# Patient Record
Sex: Male | Born: 1935 | Race: White | Hispanic: No | Marital: Married | Smoking: Former smoker
Health system: Southern US, Community
[De-identification: ages and names within clinical notes are randomized; demographics above are authoritative.]

## PROBLEM LIST (undated history)

## (undated) DIAGNOSIS — Z9889 Other specified postprocedural states: Secondary | ICD-10-CM

## (undated) DIAGNOSIS — C61 Malignant neoplasm of prostate: Secondary | ICD-10-CM

## (undated) DIAGNOSIS — I2510 Atherosclerotic heart disease of native coronary artery without angina pectoris: Secondary | ICD-10-CM

## (undated) DIAGNOSIS — I219 Acute myocardial infarction, unspecified: Secondary | ICD-10-CM

## (undated) DIAGNOSIS — N209 Urinary calculus, unspecified: Secondary | ICD-10-CM

## (undated) DIAGNOSIS — M1990 Unspecified osteoarthritis, unspecified site: Secondary | ICD-10-CM

## (undated) DIAGNOSIS — R112 Nausea with vomiting, unspecified: Secondary | ICD-10-CM

## (undated) DIAGNOSIS — Z8489 Family history of other specified conditions: Secondary | ICD-10-CM

## (undated) DIAGNOSIS — I10 Essential (primary) hypertension: Secondary | ICD-10-CM

## (undated) DIAGNOSIS — Z9981 Dependence on supplemental oxygen: Secondary | ICD-10-CM

## (undated) DIAGNOSIS — E785 Hyperlipidemia, unspecified: Secondary | ICD-10-CM

## (undated) DIAGNOSIS — E039 Hypothyroidism, unspecified: Secondary | ICD-10-CM

## (undated) DIAGNOSIS — I639 Cerebral infarction, unspecified: Secondary | ICD-10-CM

## (undated) DIAGNOSIS — K219 Gastro-esophageal reflux disease without esophagitis: Secondary | ICD-10-CM

## (undated) DIAGNOSIS — J45909 Unspecified asthma, uncomplicated: Secondary | ICD-10-CM

## (undated) DIAGNOSIS — C4490 Unspecified malignant neoplasm of skin, unspecified: Secondary | ICD-10-CM

## (undated) HISTORY — PX: INGUINAL HERNIA REPAIR: SUR1180

## (undated) HISTORY — PX: BACK SURGERY: SHX140

## (undated) HISTORY — DX: Unspecified asthma, uncomplicated: J45.909

## (undated) HISTORY — DX: Essential (primary) hypertension: I10

## (undated) HISTORY — DX: Hyperlipidemia, unspecified: E78.5

## (undated) HISTORY — PX: JOINT REPLACEMENT: SHX530

## (undated) HISTORY — PX: TONSILLECTOMY: SUR1361

## (undated) HISTORY — PX: CATARACT EXTRACTION W/ INTRAOCULAR LENS  IMPLANT, BILATERAL: SHX1307

---

## 1986-01-15 DIAGNOSIS — I639 Cerebral infarction, unspecified: Secondary | ICD-10-CM

## 1986-01-15 HISTORY — DX: Cerebral infarction, unspecified: I63.9

## 2004-03-03 ENCOUNTER — Ambulatory Visit (HOSPITAL_BASED_OUTPATIENT_CLINIC_OR_DEPARTMENT_OTHER): Admission: RE | Admit: 2004-03-03 | Discharge: 2004-03-03 | Payer: Self-pay | Admitting: Surgery

## 2004-03-03 ENCOUNTER — Ambulatory Visit (HOSPITAL_COMMUNITY): Admission: RE | Admit: 2004-03-03 | Discharge: 2004-03-03 | Payer: Self-pay | Admitting: Surgery

## 2004-03-03 ENCOUNTER — Encounter (INDEPENDENT_AMBULATORY_CARE_PROVIDER_SITE_OTHER): Payer: Self-pay | Admitting: Specialist

## 2004-09-06 ENCOUNTER — Ambulatory Visit (HOSPITAL_COMMUNITY): Admission: RE | Admit: 2004-09-06 | Discharge: 2004-09-06 | Payer: Self-pay | Admitting: Orthopedic Surgery

## 2004-09-06 ENCOUNTER — Ambulatory Visit (HOSPITAL_BASED_OUTPATIENT_CLINIC_OR_DEPARTMENT_OTHER): Admission: RE | Admit: 2004-09-06 | Discharge: 2004-09-06 | Payer: Self-pay | Admitting: Orthopedic Surgery

## 2006-02-07 ENCOUNTER — Ambulatory Visit: Admission: RE | Admit: 2006-02-07 | Discharge: 2006-05-22 | Payer: Self-pay | Admitting: *Deleted

## 2006-05-09 ENCOUNTER — Ambulatory Visit: Admission: RE | Admit: 2006-05-09 | Discharge: 2006-08-07 | Payer: Self-pay | Admitting: *Deleted

## 2006-06-29 ENCOUNTER — Emergency Department (HOSPITAL_COMMUNITY): Admission: EM | Admit: 2006-06-29 | Discharge: 2006-06-29 | Payer: Self-pay | Admitting: Emergency Medicine

## 2007-07-07 ENCOUNTER — Encounter: Payer: Self-pay | Admitting: Pulmonary Disease

## 2007-07-10 ENCOUNTER — Ambulatory Visit: Payer: Self-pay | Admitting: Pulmonary Disease

## 2007-07-10 DIAGNOSIS — I10 Essential (primary) hypertension: Secondary | ICD-10-CM | POA: Insufficient documentation

## 2007-07-10 DIAGNOSIS — R0609 Other forms of dyspnea: Secondary | ICD-10-CM

## 2007-07-10 DIAGNOSIS — R0989 Other specified symptoms and signs involving the circulatory and respiratory systems: Secondary | ICD-10-CM

## 2007-07-10 DIAGNOSIS — E785 Hyperlipidemia, unspecified: Secondary | ICD-10-CM

## 2007-07-16 ENCOUNTER — Ambulatory Visit: Payer: Self-pay | Admitting: Pulmonary Disease

## 2007-07-22 ENCOUNTER — Telehealth (INDEPENDENT_AMBULATORY_CARE_PROVIDER_SITE_OTHER): Payer: Self-pay | Admitting: *Deleted

## 2007-07-23 ENCOUNTER — Ambulatory Visit: Payer: Self-pay | Admitting: Pulmonary Disease

## 2007-07-23 DIAGNOSIS — J449 Chronic obstructive pulmonary disease, unspecified: Secondary | ICD-10-CM

## 2007-10-23 ENCOUNTER — Ambulatory Visit: Payer: Self-pay | Admitting: Pulmonary Disease

## 2007-12-26 ENCOUNTER — Telehealth (INDEPENDENT_AMBULATORY_CARE_PROVIDER_SITE_OTHER): Payer: Self-pay | Admitting: *Deleted

## 2008-03-04 ENCOUNTER — Encounter (HOSPITAL_COMMUNITY): Admission: RE | Admit: 2008-03-04 | Discharge: 2008-05-18 | Payer: Self-pay | Admitting: Urology

## 2008-04-21 ENCOUNTER — Ambulatory Visit: Payer: Self-pay | Admitting: Pulmonary Disease

## 2008-10-19 ENCOUNTER — Ambulatory Visit: Payer: Self-pay | Admitting: Pulmonary Disease

## 2009-02-22 ENCOUNTER — Encounter: Admission: RE | Admit: 2009-02-22 | Discharge: 2009-02-22 | Payer: Self-pay | Admitting: Endocrinology

## 2009-02-22 ENCOUNTER — Encounter: Payer: Self-pay | Admitting: Pulmonary Disease

## 2009-03-24 ENCOUNTER — Encounter: Payer: Self-pay | Admitting: Pulmonary Disease

## 2009-03-24 DIAGNOSIS — R930 Abnormal findings on diagnostic imaging of skull and head, not elsewhere classified: Secondary | ICD-10-CM

## 2009-03-29 ENCOUNTER — Ambulatory Visit: Payer: Self-pay | Admitting: Cardiology

## 2009-03-30 ENCOUNTER — Telehealth: Payer: Self-pay | Admitting: Pulmonary Disease

## 2009-04-28 ENCOUNTER — Ambulatory Visit: Payer: Self-pay | Admitting: Pulmonary Disease

## 2009-04-28 DIAGNOSIS — J984 Other disorders of lung: Secondary | ICD-10-CM | POA: Insufficient documentation

## 2009-05-25 ENCOUNTER — Emergency Department (HOSPITAL_COMMUNITY): Admission: EM | Admit: 2009-05-25 | Discharge: 2009-05-25 | Payer: Self-pay | Admitting: Emergency Medicine

## 2009-05-30 ENCOUNTER — Encounter (HOSPITAL_COMMUNITY): Admission: RE | Admit: 2009-05-30 | Discharge: 2009-08-28 | Payer: Self-pay | Admitting: Urology

## 2009-10-11 ENCOUNTER — Encounter: Admission: RE | Admit: 2009-10-11 | Discharge: 2009-10-11 | Payer: Self-pay | Admitting: Orthopaedic Surgery

## 2009-10-15 HISTORY — PX: ANTERIOR CERVICAL DISCECTOMY: SHX1160

## 2009-10-24 ENCOUNTER — Inpatient Hospital Stay (HOSPITAL_COMMUNITY)
Admission: RE | Admit: 2009-10-24 | Discharge: 2009-10-25 | Payer: Self-pay | Source: Home / Self Care | Admitting: Orthopaedic Surgery

## 2009-10-26 ENCOUNTER — Telehealth: Payer: Self-pay | Admitting: Pulmonary Disease

## 2009-11-23 ENCOUNTER — Ambulatory Visit: Payer: Self-pay | Admitting: Pulmonary Disease

## 2010-02-14 NOTE — Assessment & Plan Note (Signed)
Summary: rov for asthma, pulm nodule.   Copy to:  Waldron Callas Primary Provider/Referring Provider:  Altheimer  CC:  6 month follow up , uses advair , and rarely needs his rescue inhler.  History of Present Illness: the pt comes in today for f/u of his known asthma.  He has been doing very well on his current regimen, with no flares or increased rescue inhaler use.  He denies cough or congestion.  He also had a recent ct chest to f/u ?right hilar abnl, and showed only a 4mm noncalcified density in RLL as well as other calcified nodules and a calcified right hilar node.  This is most likely due to old granulomatous disease.  Current Medications (verified): 1)  Accolate 20 Mg  Tabs (Zafirlukast) .... Take 1 Tablet By Mouth Two Times A Day 2)  Proventil Hfa 108 (90 Base) Mcg/act  Aers (Albuterol Sulfate) .... Inhale 2 Puffs Every 4 To 6 Hours As Needed 3)  Advair Hfa 230-21 Mcg/act Aero (Fluticasone-Salmeterol) .... Inhale 2 Puffs Two Times A Day 4)  Lipitor 10 Mg  Tabs (Atorvastatin Calcium) .... Take 1/2 Tab As Directed 5)  Tekturna 150 Mg  Tabs (Aliskiren Fumarate) .... Take 1 Tab As Directed 6)  Norvasc 2.5 Mg Tabs (Amlodipine Besylate) .Marland Kitchen.. 1 Once Daily  Allergies (verified): 1)  ! Demerol  Review of Systems      See HPI  Vital Signs:  Patient profile:   77 year old male Height:      70 inches Weight:      160.6 pounds O2 Sat:      98 % on Room air Temp:     97.3 degrees F oral Pulse rate:   67 / minute BP sitting:   130 / 68  (left arm)  Vitals Entered By: Renold Genta RCP, LPN (April 28, 2009 9:02 AM)  O2 Flow:  Room air CC: 6 month follow up , uses advair , rarely needs his rescue inhler Is Patient Diabetic? No Comments Medications reviewed with patient Renold Genta RCP, LPN  April 28, 2009 9:08 AM    Physical Exam  General:  thin male in nad Lungs:  chest clear to auscultation Heart:  rrr, no mrg   Impression & Recommendations:  Problem # 1:  CHRONIC  OBSTRUCTIVE ASTHMA UNSPECIFIED (ICD-493.20) the pt is doing very well from an asthma standpoint.  No excessive rescue inhaler use, and no recent exacerbation.  Problem # 2:  PULMONARY NODULE (ICD-518.89) The pt has a 4mm nodule on recent ct that needs f/u in one year.  He has other findings suggestive of old granulomatous disease, and I suspect that is the explanation for the noncalcified nodule that is going to be followed up as well.  Medications Added to Medication List This Visit: 1)  Norvasc 2.5 Mg Tabs (Amlodipine besylate) .Marland Kitchen.. 1 once daily  Other Orders: Est. Patient Level II (84696)  Patient Instructions: 1)  no change in meds. 2)  can try zyrtec 10mg  one at bedtime for allergy symptoms. 3)  will check ct chest next year for nodule. 4)  followup with me in 6mos.

## 2010-02-14 NOTE — Progress Notes (Signed)
Summary: nos appt  Phone Note Call from Patient   Caller: [email or web address]  Call For: Mylinda Brook Summary of Call: In ref to 10/11 nos, pt states he will call next week to rsc. Initial call taken by: Darletta Moll,  October 26, 2009 9:50 AM

## 2010-02-14 NOTE — Miscellaneous (Signed)
Summary: order for chest ct   Clinical Lists Changes  Problems: Added new problem of ABNORMAL CHEST XRAY (ICD-793.1) Orders: Added new Referral order of Radiology Referral (Radiology) - Signed sent a cxr report from Dr. Leslie Dales where radiologist questioned right hilar abnl.  the pt has had a recent bump in psa, and after discussion with pt, he would like verification.  Will order ct chest.

## 2010-02-14 NOTE — Progress Notes (Signed)
Summary: results  Phone Note Call from Patient Call back at Home Phone [phone]   Caller: Patient Call For: clance Summary of Call: pt wants CT results.  Initial call taken by: Tivis Ringer, CNA,  March 30, 2009 4:29 PM  Follow-up for Phone Call        pt calling for CT results from 03-29-2009.  Results unsigned.  Please advise.  Thank you.  Arman Filter LPN  March 30, 2009 4:53 PM   Additional Follow-up for Phone Call Additional follow up Details #1::        done Additional Follow-up by: Barbaraann Share MD,  March 31, 2009 6:07 PM

## 2010-02-14 NOTE — Assessment & Plan Note (Signed)
Summary: rov for asthma, rhinitis   Visit Type:  Follow-up Copy to:  Guthrie Callas Primary Provider/Referring Provider:  Altheimer  CC:  follow up. pt states breahting has been doing "fine". Pt c/o nasal congestion daily. Pt recently had surgery oct 10  to seperate 2 disks in the back of neck. pt needs rx for advair hfa. pt would like flu shot today .  History of Present Illness: the pt comes in today for f/u of his fixed asthma.  He has been compliant with his meds, and feels that he is doing quite well.  He has not had an exacerbation in years, and rarely uses his rescue inhaler.  He still has transient very mild dysphonia, but does not really bother him that much.  He denies any cough or congestion.  He continues to have issues with postnasal drip, and zyrtec really didn't help much.  Current Medications (verified): 1)  Accolate 20 Mg  Tabs (Zafirlukast) .... Take 1 Tablet By Mouth Two Times A Day 2)  Proventil Hfa 108 (90 Base) Mcg/act  Aers (Albuterol Sulfate) .... Inhale 2 Puffs Every 4 To 6 Hours As Needed 3)  Advair Hfa 230-21 Mcg/act Aero (Fluticasone-Salmeterol) .... Inhale 2 Puffs Two Times A Day 4)  Lipitor 10 Mg  Tabs (Atorvastatin Calcium) .... Take 1/2 Tab As Directed 5)  Tekturna 150 Mg  Tabs (Aliskiren Fumarate) .... Take 1 Tab As Directed 6)  Norvasc 2.5 Mg Tabs (Amlodipine Besylate) .Marland Kitchen.. 1 Once Daily 7)  Hydrocodone-Acetaminophen 5-325 Mg Tabs (Hydrocodone-Acetaminophen) .... 2 Tablets At Bedtime  Allergies (verified): 1)  ! Demerol  Review of Systems       The patient complains of difficulty swallowing and nasal congestion/difficulty breathing through nose.  The patient denies shortness of breath with activity, shortness of breath at rest, productive cough, non-productive cough, coughing up blood, chest pain, irregular heartbeats, acid heartburn, indigestion, loss of appetite, weight change, abdominal pain, tooth/dental problems, headaches, sneezing, itching, ear ache, anxiety,  depression, hand/feet swelling, joint stiffness or pain, rash, change in color of mucus, and fever.    Vital Signs:  Patient profile:   77 year old male Height:      70 inches Weight:      147.50 pounds O2 Sat:      99 % on Room air Temp:     97.4 degrees F oral Pulse rate:   73 / minute BP sitting:   122 / 64  (left arm) Cuff size:   regular  Vitals Entered By: Carver Fila (November 23, 2009 10:15 AM)  O2 Flow:  Room air CC: follow up. pt states breahting has been doing "fine". Pt c/o nasal congestion daily. Pt recently had surgery oct 10  to seperate 2 disks in the back of neck. pt needs rx for advair hfa. pt would like flu shot today  Comments meds and allergies updated Phone number updated Carver Fila  November 23, 2009 10:15 AM    Physical Exam  General:  thin male in nad Nose:  no purulence noted. Lungs:  totally clear to auscultation no wheezing or rhonchi Heart:  rrr, 2/6 sem Extremities:  no edema or cyanosis Neurologic:  alert and oriented, moves all 4.   Impression & Recommendations:  Problem # 1:  CHRONIC OBSTRUCTIVE ASTHMA UNSPECIFIED (ICD-493.20) the pt is doing very well with advair from a symptom control standpoint.  He has not had an acute exacerbation since the last visit, and rarely uses rescue inhaler.  He has  dysphonia for 2-3 hours only after using his inhaler, but is feels the benefits of the med far outweighs its side effects.  He also has rhinitis with postnasal drip, and I wonder how much this is actually contributing to his dysphonia?  He did not see improvement with otc zyrtec, so will try astepro...samples given.  Medications Added to Medication List This Visit: 1)  Hydrocodone-acetaminophen 5-325 Mg Tabs (Hydrocodone-acetaminophen) .... 2 tablets at bedtime  Other Orders: Est. Patient Level III (78295)  Patient Instructions: 1)  no change in asthma meds. 2)  trial of astepro 2 sprays each nostril each am.  let me know if helps. 3)  will  give you a flu shot today 4)  followup with me in 6mos  Prescriptions: ADVAIR HFA 230-21 MCG/ACT AERO (FLUTICASONE-SALMETEROL) inhale 2 puffs two times a day  #3 x 6   Entered and Authorized by:   Barbaraann Share MD   Signed by:   Barbaraann Share MD on 11/23/2009   Method used:   Print then Give to Patient   RxID:   [id number]     Appended Document: rov for asthma, rhinitis    Clinical Lists Changes  Orders: Added new Service order of Influenza Vaccine MCR [phone]) - Signed Observations: Added new observation of FLU VAXLOT: 1113 3P (11/23/2009 12:10) Added new observation of FLU VAX EXP: 06/2010 (11/23/2009 12:10) Added new observation of FLU VAXBY: Mindy Silva (11/23/2009 12:10) Added new observation of FLU VAXRTE: IM (11/23/2009 12:10) Added new observation of FLU VAX DSE: 0.5 ml (11/23/2009 12:10) Added new observation of FLU VAXMFR: novartis (11/23/2009 12:10) Added new observation of FLU VAX SITE: left deltoid (11/23/2009 12:10) Added new observation of FLU VAX: Fluvax MCR (11/23/2009 12:10)       Immunizations Administered:  Influenza Vaccine # 1:    Vaccine Type: Fluvax MCR    Site: left deltoid    Mfr: novartis    Dose: 0.5 ml    Route: IM    Given by: Carver Fila    Exp. Date: 06/2010    Lot #: [id number]  Flu Vaccine Consent Questions:    Do you have a history of severe allergic reactions to this vaccine? no    Any prior history of allergic reactions to egg and/or gelatin? no    Do you have a sensitivity to the preservative Thimersol? no    Do you have a past history of Guillan-Barre Syndrome? no    Do you currently have an acute febrile illness? no    Have you ever had a severe reaction to latex? no    Vaccine information given and explained to patient? yes

## 2010-03-02 ENCOUNTER — Other Ambulatory Visit: Payer: Self-pay | Admitting: Endocrinology

## 2010-03-02 ENCOUNTER — Ambulatory Visit
Admission: RE | Admit: 2010-03-02 | Discharge: 2010-03-02 | Disposition: A | Discharge status: Home / Self Care | Payer: Medicare Other | Source: Ambulatory Visit | Attending: Endocrinology | Admitting: Endocrinology

## 2010-03-24 ENCOUNTER — Encounter: Payer: Self-pay | Admitting: Pulmonary Disease

## 2010-03-27 ENCOUNTER — Other Ambulatory Visit: Payer: Self-pay | Admitting: Pulmonary Disease

## 2010-03-27 DIAGNOSIS — R911 Solitary pulmonary nodule: Secondary | ICD-10-CM

## 2010-03-28 NOTE — Miscellaneous (Addendum)
  Clinical Lists Changes  Orders: Added new Referral order of Radiology Referral (Radiology) - Signed  recent note from dr altheimer with cxr showing opacity noted in LLL.  Unclear if pt had pna symptoms or not will need f/u to resolution.  due in may for f/u ct for nodule....will change to this month in light of new findings.  Appended Document:  megan, let pt know a recent cxr by dr altheimer showed something in LLL.  He was due for ct chest in may to f/u nodule, but lets speed that up to now to eval this and the new finding.  order already in   Appended Document:  called and spoke with pt.  informed him of the above information.  pt verbalized understanding.  pt's ct has already been scheduled for 03-30-2010 at [facility] at 11:00

## 2010-03-29 LAB — URINALYSIS, ROUTINE W REFLEX MICROSCOPIC
Bilirubin Urine: NEGATIVE
Glucose, UA: NEGATIVE mg/dL
Hgb urine dipstick: NEGATIVE
Ketones, ur: NEGATIVE mg/dL
Nitrite: NEGATIVE
Protein, ur: NEGATIVE mg/dL
Specific Gravity, Urine: 1.025 (ref 1.005–1.030)
Urobilinogen, UA: 0.2 mg/dL (ref 0.0–1.0)
pH: 5.5 (ref 5.0–8.0)

## 2010-03-29 LAB — COMPREHENSIVE METABOLIC PANEL
ALT: 20 U/L (ref 0–53)
AST: 20 U/L (ref 0–37)
Albumin: 4.3 g/dL (ref 3.5–5.2)
Alkaline Phosphatase: 49 U/L (ref 39–117)
BUN: 18 mg/dL (ref 6–23)
CO2: 29 mEq/L (ref 19–32)
Calcium: 9.5 mg/dL (ref 8.4–10.5)
Chloride: 107 mEq/L (ref 96–112)
Creatinine, Ser: 1.01 mg/dL (ref 0.4–1.5)
GFR calc Af Amer: >60 mL/min (ref >60)
GFR calc non Af Amer: >60 mL/min (ref >60)
Glucose, Bld: 101 mg/dL — ABNORMAL HIGH (ref 70–99)
Potassium: 4.4 mEq/L (ref 3.5–5.1)
Sodium: 141 mEq/L (ref 135–145)
Total Bilirubin: 0.9 mg/dL (ref 0.3–1.2)
Total Protein: 6.7 g/dL (ref 6.0–8.3)

## 2010-03-29 LAB — CBC
HCT: 36.9 % — ABNORMAL LOW (ref 39.0–52.0)
Hemoglobin: 13 g/dL (ref 13.0–17.0)
MCH: 35.7 pg — ABNORMAL HIGH (ref 26.0–34.0)
MCHC: 35.2 g/dL (ref 30.0–36.0)
MCV: 101.4 fL — ABNORMAL HIGH (ref 78.0–100.0)
Platelets: 183 10*3/uL (ref 150–400)
RBC: 3.64 MIL/uL — ABNORMAL LOW (ref 4.22–5.81)
RDW: 12.5 % (ref 11.5–15.5)
WBC: 4.3 10*3/uL (ref 4.0–10.5)

## 2010-03-29 LAB — PROTIME-INR
INR: 1.09 (ref 0.00–1.49)
Prothrombin Time: 14.3 seconds (ref 11.6–15.2)

## 2010-03-29 LAB — SURGICAL PCR SCREEN
MRSA, PCR: NEGATIVE
Staphylococcus aureus: POSITIVE — AB

## 2010-03-30 ENCOUNTER — Ambulatory Visit (INDEPENDENT_AMBULATORY_CARE_PROVIDER_SITE_OTHER)
Admission: RE | Admit: 2010-03-30 | Discharge: 2010-03-30 | Disposition: A | Discharge status: Home / Self Care | Payer: Medicare Other | Source: Ambulatory Visit | Attending: Pulmonary Disease | Admitting: Pulmonary Disease

## 2010-03-30 DIAGNOSIS — R911 Solitary pulmonary nodule: Secondary | ICD-10-CM

## 2010-03-30 DIAGNOSIS — J984 Other disorders of lung: Secondary | ICD-10-CM

## 2010-04-04 LAB — DIFFERENTIAL
Basophils Absolute: 0 10*3/uL (ref 0.0–0.1)
Basophils Relative: 0 % (ref 0–1)
Eosinophils Absolute: 0 10*3/uL (ref 0.0–0.7)
Eosinophils Relative: 1 % (ref 0–5)
Lymphocytes Relative: 7 % — ABNORMAL LOW (ref 12–46)
Lymphs Abs: 0.5 10*3/uL — ABNORMAL LOW (ref 0.7–4.0)
Monocytes Absolute: 0.3 10*3/uL (ref 0.1–1.0)
Monocytes Relative: 4 % (ref 3–12)
Neutro Abs: 6.4 10*3/uL (ref 1.7–7.7)
Neutrophils Relative %: 88 % — ABNORMAL HIGH (ref 43–77)

## 2010-04-04 LAB — URINE CULTURE

## 2010-04-04 LAB — CBC
HCT: 36.9 % — ABNORMAL LOW (ref 39.0–52.0)
Hemoglobin: 12.5 g/dL — ABNORMAL LOW (ref 13.0–17.0)
MCHC: 33.9 g/dL (ref 30.0–36.0)
MCV: 104.6 fL — ABNORMAL HIGH (ref 78.0–100.0)
Platelets: 179 10*3/uL (ref 150–400)
RBC: 3.53 MIL/uL — ABNORMAL LOW (ref 4.22–5.81)
RDW: 13.3 % (ref 11.5–15.5)
WBC: 7.3 10*3/uL (ref 4.0–10.5)

## 2010-04-04 LAB — POCT I-STAT, CHEM 8
Calcium, Ion: 1.12 mmol/L (ref 1.12–1.32)
Creatinine, Ser: 0.9 mg/dL (ref 0.4–1.5)
Glucose, Bld: 128 mg/dL — ABNORMAL HIGH (ref 70–99)
HCT: 36 % — ABNORMAL LOW (ref 39.0–52.0)
Hemoglobin: 12.2 g/dL — ABNORMAL LOW (ref 13.0–17.0)
TCO2: 25 mmol/L (ref 0–100)

## 2010-04-04 LAB — URINALYSIS, ROUTINE W REFLEX MICROSCOPIC
Bilirubin Urine: NEGATIVE
Hgb urine dipstick: NEGATIVE
Ketones, ur: NEGATIVE mg/dL
Nitrite: NEGATIVE
Urobilinogen, UA: 0.2 mg/dL (ref 0.0–1.0)

## 2010-05-19 ENCOUNTER — Encounter: Payer: Self-pay | Admitting: Pulmonary Disease

## 2010-05-24 ENCOUNTER — Ambulatory Visit: Payer: Self-pay | Admitting: Pulmonary Disease

## 2010-06-02 NOTE — Op Note (Signed)
NAMETESEAN, STUMP                ACCOUNT NO.:  [id number]   MEDICAL RECORD NO.:  [id number]          PATIENT TYPE:  AMB   LOCATION:  DSC                          FACILITY:  [facility]   PHYSICIAN:  Cindee Salt, M.D.       DATE OF BIRTH:  Apr 12, 1923   DATE OF PROCEDURE:  09/06/2004  DATE OF DISCHARGE:                                 OPERATIVE REPORT   PREOPERATIVE DIAGNOSIS:  Paronychia, right thumb.   POSTOPERATIVE DIAGNOSIS:  Paronychia, right thumb.   OPERATION:  Incision and drainage, paronychia, right thumb, with removal of  portion of nail plate.   SURGEON:  Cindee Salt, M.D.   ASSISTANT:  Carolyne Fiscal R.N.   ANESTHESIA:  Metacarpal block.   HISTORY:  The patient is a 77 year old male with a history of a paronychia  that has been present for several months.  He is admitted for removal of a  portion of nail plate with cultures.   PROCEDURE:  The patient was brought to the operating room, prepped and  draped using DuraPrep.  A metacarpal block was given with 5% Xylocaine  without epinephrine 1%.  After adequate anesthesia was afforded, a Penrose  drain was used for tourniquet control at the base of the thumb.  The nail  plate was then undermined along its radial border where the paronychia was.  This was then removed with a small pair of scissors and the Therapist, nutritional.  The area of paronychia was opened.  Cultures were taken, and this area was  packed, a sterile compressive dressing applied.  The patient tolerated the  procedure well.  He is discharged home to return to the [facility] in one week on Vicodin and Septra DS.           ______________________________  Cindee Salt, M.D.     GK/MEDQ  D:  09/06/2004  T:  09/06/2004  Job:  [id number]

## 2010-06-02 NOTE — Op Note (Signed)
NAMEROMONE, SHAFF                ACCOUNT NO.:  [id number]   MEDICAL RECORD NO.:  [id number]          PATIENT TYPE:  AMB   LOCATION:  DSC                          FACILITY:  [facility]   PHYSICIAN:  Thornton Park. Daphine Deutscher, MD  DATE OF BIRTH:  May 14, 1923   DATE OF PROCEDURE:  03/03/2004  DATE OF DISCHARGE:                                 OPERATIVE REPORT   CCS NUMBER:  [id number]   PREOPERATIVE DIAGNOSIS:  Recurrent right inguinal hernia.   POSTOPERATIVE DIAGNOSIS:  Recurrent indirect inguinal hernia, after a prior  laparoscopic bilateral inguinal herniorrhaphy done several years ago.   PROCEDURE:  Ligation of sac and repair of the floor with Marlex mesh.   SURGEON:  Thornton Park. Daphine Deutscher, MD   ANESTHESIA:  General by LMA.   DESCRIPTION OF PROCEDURE:  Joe Soto is a 77 year old gentleman who is  about several years out from a laparoscopic bilateral inguinal hernia  repair.  He is brought to the OR at [facility], and given  general by LMA.  After prepping with Betadine, an oblique incision was made  and carried down to the external oblique (which I incised along the fibers).  I protected the nerve roots and dissected the cord structures.  In an  anteromedial area found a recurrent indirect.  The rest of the floor was  checked, and it appeared to be intact.  There was no femoral hernia and no  medial direct hernia.   I then did a high ligation of the sac, and then I repaired the entire floor;  again, going around the cord structures with a piece of mesh, I sutured  along the inguinal ligament, then sutured medially and then sutured to  itself with a horizontal mattress suture.  This was then tucked beneath the  external oblique and the external oblique was closed  with running 2-0 Vicryl.  Then 4-0 Vicryl was used in the subcutaneous  tissue, and then 4-0 Vicryl was used subcuticularly along with 5-0 Vicryl,  and then Dermabond was used on the skin.  The patient seemed to tolerate  the  procedure well, and was taken to the recovery room in satisfactory  condition.      MBM/MEDQ  D:  03/03/2004  T:  03/04/2004  Job:  [id number]   cc:   Veverly Fells. Altheimer, M.D.  [address], [address]  [place]  [place] [address]  Fax: [phone]

## 2010-06-05 ENCOUNTER — Other Ambulatory Visit: Payer: Self-pay | Admitting: Urology

## 2010-06-05 DIAGNOSIS — C61 Malignant neoplasm of prostate: Secondary | ICD-10-CM

## 2010-07-27 ENCOUNTER — Other Ambulatory Visit: Payer: Self-pay | Admitting: Pulmonary Disease

## 2010-07-31 ENCOUNTER — Encounter (HOSPITAL_COMMUNITY): Payer: Self-pay

## 2010-07-31 ENCOUNTER — Encounter (HOSPITAL_COMMUNITY)
Admission: RE | Admit: 2010-07-31 | Discharge: 2010-07-31 | Disposition: A | Discharge status: Home / Self Care | Payer: Medicare Other | Source: Ambulatory Visit | Attending: Urology | Admitting: Urology

## 2010-07-31 DIAGNOSIS — C61 Malignant neoplasm of prostate: Secondary | ICD-10-CM

## 2010-07-31 DIAGNOSIS — C801 Malignant (primary) neoplasm, unspecified: Secondary | ICD-10-CM | POA: Insufficient documentation

## 2010-07-31 MED ORDER — TECHNETIUM TC 99M MEDRONATE IV KIT
23.3000 | PACK | Freq: Once | INTRAVENOUS | Status: AC | PRN
  Administered 2010-07-31: 23 via INTRAVENOUS

## 2010-09-12 ENCOUNTER — Other Ambulatory Visit: Payer: Self-pay | Admitting: Pulmonary Disease

## 2010-09-12 MED ORDER — ZAFIRLUKAST 20 MG PO TABS: 20.0000 mg | ORAL_TABLET | Freq: Two times a day (BID) | ORAL | Status: DC

## 2010-10-05 ENCOUNTER — Encounter: Payer: Self-pay | Admitting: Pulmonary Disease

## 2010-10-05 ENCOUNTER — Ambulatory Visit (INDEPENDENT_AMBULATORY_CARE_PROVIDER_SITE_OTHER): Payer: Medicare Other | Admitting: Pulmonary Disease

## 2010-10-05 DIAGNOSIS — Z23 Encounter for immunization: Secondary | ICD-10-CM

## 2010-10-05 DIAGNOSIS — J449 Chronic obstructive pulmonary disease, unspecified: Secondary | ICD-10-CM

## 2010-10-05 DIAGNOSIS — J984 Other disorders of lung: Secondary | ICD-10-CM

## 2010-10-05 NOTE — Patient Instructions (Addendum)
Will try dulera 100/5 2 inhalations am and pm.  Rinse mouth well.  Let me know if this works better you. Will set up for scan of chest to followup the nodules noted earlier in the year.  I will call you with results. followup with me in 6 mos if doing well.

## 2010-10-05 NOTE — Progress Notes (Signed)
  Subjective:    Patient ID: Joe Soto, male    DOB: 29-Sep-1923, 77 y.o.   MRN: [id number]  HPI Patient comes in today for followup of his chronic obstructive fixed asthma.  He is staying on his medications compliantly, and has not had an acute exacerbation or need for his rescue inhaler since the last visit.  He is having some upper airway hoarseness from using the Advair, and would like to consider an alternative.  Overall he feels that he is doing well.   Review of Systems  Constitutional: Negative for fever and unexpected weight change.  HENT: Positive for voice change. Negative for ear pain, nosebleeds, congestion, sore throat, rhinorrhea, sneezing, trouble swallowing, dental problem, postnasal drip and sinus pressure.   Eyes: Negative for redness and itching.  Respiratory: Negative for cough, chest tightness, shortness of breath and wheezing.   Cardiovascular: Negative for palpitations and leg swelling.  Gastrointestinal: Negative for nausea and vomiting.  Genitourinary: Negative for dysuria.  Musculoskeletal: Negative for joint swelling.  Skin: Negative for rash.  Neurological: Negative for headaches.  Hematological: Does not bruise/bleed easily.  Psychiatric/Behavioral: Negative for dysphoric mood. The patient is not nervous/anxious.        Objective:   Physical Exam Thin male in no acute distress Nose without purulence or discharge noted Chest clear to auscultation, however there is a mild upper airway pseudo-wheezing with transmission to the bases Cardiac exam with regular rate and rhythm Lower extremities without edema, no cyanosis noted Alert and oriented, moves all 4 extremities.       Assessment & Plan:

## 2010-10-05 NOTE — Assessment & Plan Note (Signed)
The patient has fixed asthma, but has done well on combination therapy.  He is having dysphonia related to his Advair, and I would like to try him on dulera which has a smaller particles size.  He also is due for his flu shot this year.

## 2010-10-05 NOTE — Assessment & Plan Note (Signed)
The patient is due for his followup CT chest to relook at his pulmonary nodules.  He has a history of prostate cancer that puts him into a higher risk group.  More than likely however, these are related to inflammatory foci or possibly MAC colonization.

## 2010-10-05 NOTE — Progress Notes (Signed)
Addended by: Darrell Jewel on: 10/05/2010 12:35 PM   Modules accepted: Orders

## 2010-10-11 ENCOUNTER — Ambulatory Visit (INDEPENDENT_AMBULATORY_CARE_PROVIDER_SITE_OTHER)
Admission: RE | Admit: 2010-10-11 | Discharge: 2010-10-11 | Disposition: A | Discharge status: Home / Self Care | Payer: Medicare Other | Source: Ambulatory Visit | Attending: Pulmonary Disease | Admitting: Pulmonary Disease

## 2010-10-11 DIAGNOSIS — J984 Other disorders of lung: Secondary | ICD-10-CM

## 2010-10-31 ENCOUNTER — Other Ambulatory Visit: Payer: Self-pay | Admitting: Pulmonary Disease

## 2010-11-02 LAB — COMPREHENSIVE METABOLIC PANEL
AST: 13
Albumin: 3.5
Alkaline Phosphatase: 60
BUN: 20
CO2: 27
Chloride: 107
Creatinine, Ser: 1.01
GFR calc Af Amer: >60
GFR calc non Af Amer: >60
Potassium: 3.8
Total Bilirubin: 0.9

## 2010-11-02 LAB — DIFFERENTIAL
Basophils Absolute: 0
Basophils Relative: 0
Eosinophils Absolute: 0.4
Eosinophils Relative: 7 — ABNORMAL HIGH
Monocytes Absolute: 0.3

## 2010-11-02 LAB — POCT CARDIAC MARKERS: Operator id: 2725

## 2010-11-02 LAB — CBC
HCT: 38.8 — ABNORMAL LOW
MCV: 99.1
Platelets: 169
RBC: 3.92 — ABNORMAL LOW
WBC: 5.1

## 2011-04-04 ENCOUNTER — Ambulatory Visit (INDEPENDENT_AMBULATORY_CARE_PROVIDER_SITE_OTHER): Payer: Medicare Other | Admitting: Pulmonary Disease

## 2011-04-04 ENCOUNTER — Encounter: Payer: Self-pay | Admitting: Pulmonary Disease

## 2011-04-04 VITALS — BP 148/72 | HR 63 | Temp 97.4°F | Ht 71.0 in | Wt 160.6 lb

## 2011-04-04 DIAGNOSIS — J449 Chronic obstructive pulmonary disease, unspecified: Secondary | ICD-10-CM

## 2011-04-04 NOTE — Patient Instructions (Signed)
Try the astepro samples 2 each nostril each am to see if hoarseness improves.  If not, would consider changing advair to something else to see if does improve. followup with me in 6mos, or sooner if having breathing issues.

## 2011-04-04 NOTE — Assessment & Plan Note (Signed)
The patient is doing extremely well on his current inhaler regimen, and has had no acute exacerbations or increased rescue inhaler use.  He is still having some hoarseness, but also postnasal drip.  He has asked to produce samples at home, and I have asked him to use this to see if his hoarseness resolved.  If it does not, then I would consider changing Advair to a different inhaler that has a smaller particles size.

## 2011-04-04 NOTE — Progress Notes (Signed)
  Subjective:    Patient ID: Joe Soto, male    DOB: [birth year], 78 y.o.   MRN: [id number]  HPI The patient comes in today for followup of his known chronic obstructive asthma.  He is doing very well his current inhaler regimen, and has had no acute exacerbations or increased rescue inhaler use.  He is still having hoarseness, but admits to having a lot of postnasal drip.  He was given samples of aspirin to try it last visit, but never did this.  He still has the samples, and I have asked him to try it to see if things improve.  I've also discussed with him the particle size of Advair and how it can cause increased hoarseness.  He has tried [place] which had the same therapeutic benefit, but could not remember if his hoarseness was better.   Review of Systems  Constitutional: Negative for fever and unexpected weight change.  HENT: Positive for rhinorrhea, voice change and postnasal drip. Negative for ear pain, nosebleeds, congestion, sore throat, sneezing, trouble swallowing, dental problem and sinus pressure.   Eyes: Negative for redness and itching.  Respiratory: Negative for cough, chest tightness, shortness of breath and wheezing.   Cardiovascular: Negative for palpitations and leg swelling.  Gastrointestinal: Negative for nausea and vomiting.  Genitourinary: Negative for dysuria.  Musculoskeletal: Negative for joint swelling.  Skin: Negative for rash.  Neurological: Negative for headaches.  Hematological: Does not bruise/bleed easily.  Psychiatric/Behavioral: Negative for dysphoric mood. The patient is not nervous/anxious.        Objective:   Physical Exam Well-developed male in no acute distress Nose without purulence or discharge noted Chest with mild decrease in breath sounds, but no wheezes or rhonchi Cardiac exam with regular rate and rhythm Lower extremities without edema, no cyanosis noted Alert and oriented, moves all 4 extremities.       Assessment & Plan:

## 2011-07-12 ENCOUNTER — Other Ambulatory Visit: Payer: Self-pay | Admitting: Pulmonary Disease

## 2011-07-13 ENCOUNTER — Other Ambulatory Visit: Payer: Self-pay | Admitting: Endocrinology

## 2011-07-13 DIAGNOSIS — R1310 Dysphagia, unspecified: Secondary | ICD-10-CM

## 2011-07-16 ENCOUNTER — Telehealth: Payer: Self-pay | Admitting: Urology

## 2011-07-16 NOTE — Telephone Encounter (Signed)
Forward to Dr. Marcine Matar for review on 07-16-11 ym

## 2011-07-25 ENCOUNTER — Ambulatory Visit
Admission: RE | Admit: 2011-07-25 | Discharge: 2011-07-25 | Disposition: A | Discharge status: Home / Self Care | Payer: Medicare Other | Source: Ambulatory Visit | Attending: Endocrinology | Admitting: Endocrinology

## 2011-07-25 DIAGNOSIS — R1310 Dysphagia, unspecified: Secondary | ICD-10-CM

## 2011-08-21 ENCOUNTER — Other Ambulatory Visit: Payer: Self-pay | Admitting: Urology

## 2011-08-21 DIAGNOSIS — C61 Malignant neoplasm of prostate: Secondary | ICD-10-CM

## 2011-08-30 ENCOUNTER — Encounter (HOSPITAL_COMMUNITY)
Admission: RE | Admit: 2011-08-30 | Discharge: 2011-08-30 | Disposition: A | Discharge status: Home / Self Care | Payer: Medicare Other | Source: Ambulatory Visit | Attending: Urology | Admitting: Urology

## 2011-08-30 DIAGNOSIS — C61 Malignant neoplasm of prostate: Secondary | ICD-10-CM

## 2011-08-30 MED ORDER — TECHNETIUM TC 99M MEDRONATE IV KIT
23.5000 | PACK | Freq: Once | INTRAVENOUS | Status: AC | PRN
  Administered 2011-08-30: 23.5 via INTRAVENOUS

## 2011-10-09 ENCOUNTER — Ambulatory Visit: Payer: Medicare Other | Admitting: Pulmonary Disease

## 2011-10-09 ENCOUNTER — Encounter: Payer: Self-pay | Admitting: Pulmonary Disease

## 2011-10-09 ENCOUNTER — Ambulatory Visit (INDEPENDENT_AMBULATORY_CARE_PROVIDER_SITE_OTHER): Payer: Medicare Other | Admitting: Pulmonary Disease

## 2011-10-09 VITALS — BP 138/68 | HR 67 | Ht 71.0 in | Wt 152.2 lb

## 2011-10-09 DIAGNOSIS — J449 Chronic obstructive pulmonary disease, unspecified: Secondary | ICD-10-CM

## 2011-10-09 DIAGNOSIS — J984 Other disorders of lung: Secondary | ICD-10-CM

## 2011-10-09 NOTE — Progress Notes (Signed)
  Subjective:    Patient ID: Joe Soto, male    DOB: 06/11/1923, 78 y.o.   MRN: [id number]  HPI The pt comes in today for f/u of his known asthma.  He has been doing very well on advair, and has had no acute exacerbation or need for rescue medication. He is satisfied with his exertional tolerance.  He continues to have dysphonia, part of which is due to his inhaler, and also secondary to his postnasal drip.  He has been tried on topical anti-histamine spray with no success.  I have offered on multiple occasions to change his Advair to another inhaler to see if that will help, but he is quite satisfied with the asthma treatment currently.  Review of Systems  Constitutional: Negative for fever and unexpected weight change.  HENT: Positive for congestion and rhinorrhea. Negative for ear pain, nosebleeds, sore throat, sneezing, trouble swallowing, dental problem, postnasal drip and sinus pressure.   Eyes: Negative for redness and itching.  Respiratory: Negative for cough, chest tightness, shortness of breath and wheezing.   Cardiovascular: Negative for palpitations and leg swelling.  Gastrointestinal: Negative for nausea and vomiting.  Genitourinary: Negative for dysuria.  Musculoskeletal: Negative for joint swelling.  Skin: Negative for rash.  Neurological: Negative for headaches.  Hematological: Bruises/bleeds easily.  Psychiatric/Behavioral: Negative for dysphoric mood. The patient is not nervous/anxious.        Objective:   Physical Exam Wd male in nad Nose without purulence or discharge noted. Chest totally clear to auscultation Cor with rrr LE without edema, no cyanosis Alert and oriented, moves all 4.        Assessment & Plan:

## 2011-10-09 NOTE — Assessment & Plan Note (Signed)
The pt is doing very well from an asthma standpoint on advair.  He is satisfied with exertional tolerance, and rarely uses rescue inhaler.  He is still having dysphonia, unclear how much is due to his advair vs postnasal drip.  He saw no difference with astepro.  Will try chlorpheniramine, and he is to let me know if he wishes to see an allergist or change advair to a different medication.

## 2011-10-09 NOTE — Patient Instructions (Addendum)
Continue with advair, but if the hoarseness continues to be an issue for you, we can consider trying a different asthma medication. Try chlorpheniramine 4mg  over the counter, take 2 at bedtime to see if helps with postnasal drip Will schedule you for your last ct chest to followup your lung spots. Make sure you get your flu shot from your primary md. followup with me in 6 mos.

## 2011-10-09 NOTE — Assessment & Plan Note (Signed)
The pt is due for one more f/u.  Will schedule this.

## 2011-10-23 ENCOUNTER — Ambulatory Visit (INDEPENDENT_AMBULATORY_CARE_PROVIDER_SITE_OTHER)
Admission: RE | Admit: 2011-10-23 | Discharge: 2011-10-23 | Disposition: A | Discharge status: Home / Self Care | Payer: Medicare Other | Source: Ambulatory Visit | Attending: Pulmonary Disease | Admitting: Pulmonary Disease

## 2011-10-23 DIAGNOSIS — J984 Other disorders of lung: Secondary | ICD-10-CM

## 2011-11-07 ENCOUNTER — Other Ambulatory Visit: Payer: Self-pay | Admitting: Pulmonary Disease

## 2012-01-02 ENCOUNTER — Other Ambulatory Visit (HOSPITAL_COMMUNITY): Payer: Self-pay | Admitting: Orthopaedic Surgery

## 2012-01-02 ENCOUNTER — Encounter (HOSPITAL_COMMUNITY): Payer: Self-pay | Admitting: Respiratory Therapy

## 2012-01-04 NOTE — H&P (Signed)
TOTAL KNEE ADMISSION H&P  Patient is being admitted for right total knee arthroplasty.  Subjective:  Chief Complaint:right knee pain.  HPI: Joe Soto, 78 y.o. male, has a history of pain and functional disability in the right knee due to arthritis and has failed non-surgical conservative treatments for greater than 12 weeks to includeNSAID's and/or analgesics and corticosteriod injections.  Onset of symptoms was gradual, starting 5 years ago with gradually worsening course since that time. The patient noted no past surgery on the right knee(s).  Patient currently rates pain in the right knee(s) at 6 out of 10 with activity. Patient has worsening of pain with activity and weight bearing and pain that interferes with activities of daily living.  Patient has evidence of subchondral sclerosis, periarticular osteophytes and joint space narrowing by imaging studies. There is no active infection.  Patient Active Problem List   Diagnosis Date Noted  . PULMONARY NODULE 04/28/2009  . CHRONIC OBSTRUCTIVE ASTHMA UNSPECIFIED 07/23/2007  . HYPERLIPIDEMIA 07/10/2007  . HYPERTENSION 07/10/2007   Past Medical History  Diagnosis Date  . Hypertension   . Hyperlipidemia   . Chronic asthma   . Hypothyroidism   . Stones in the urinary tract   . Stroke 70    tia  . GERD (gastroesophageal reflux disease)   . Cancer 08    prostate  . Arthritis     Past Surgical History  Procedure Date  . Nerve surgery     2 compressed nerves in neck  . Cervical disc surgery 10    Prescriptions prior to admission  Medication Sig Dispense Refill  . albuterol (PROVENTIL HFA;VENTOLIN HFA) 108 (90 BASE) MCG/ACT inhaler Inhale 2 puffs into the lungs every 6 (six) hours as needed. For shortness of breath      . amLODipine (NORVASC) 5 MG tablet Take 5 mg by mouth daily.      . calcium-vitamin D (OSCAL WITH D) 500-200 MG-UNIT per tablet Take 2 tablets by mouth daily. 600 mg+d3 400iu      . cholecalciferol (VITAMIN D)  1000 UNITS tablet Take 2,000 Units by mouth daily.      . fluticasone-salmeterol (ADVAIR HFA) 230-21 MCG/ACT inhaler Inhale 2 puffs into the lungs 2 (two) times daily.        Marland Kitchen levothyroxine (SYNTHROID, LEVOTHROID) 75 MCG tablet Take 75 mcg by mouth daily.      . Multiple Vitamin (MULTIVITAMIN WITH MINERALS) TABS Take 1 tablet by mouth daily.      Marland Kitchen omeprazole (PRILOSEC) 40 MG capsule Take 40 mg by mouth 2 (two) times daily.      . zafirlukast (ACCOLATE) 20 MG tablet Take 20 mg by mouth 2 (two) times daily.      . fish oil-omega-3 fatty acids 1000 MG capsule Take 1,200 g by mouth daily.       Allergies  Allergen Reactions  . Shellfish Allergy Swelling    Shrimp and clams  . Codeine Nausea And Vomiting  . Pineapple Nausea Only  . Meperidine Hcl     Demerol/ Darvon  REACTION: nausea/vomiting.    History  Substance Use Topics  . Smoking status: Never Smoker   . Smokeless tobacco: Not on file  . Alcohol Use: 0.5 oz/week    1 drink(s) per week    Family History  Problem Relation Age of Onset  . Emphysema Mother      Review of Systems  Constitutional: Negative.   HENT: Negative.   Eyes: Negative.   Respiratory: Negative.  Cardiovascular: Negative.   Gastrointestinal: Negative.   Genitourinary: Negative.   Musculoskeletal: Positive for joint pain.  Skin: Negative.   Neurological: Negative.   Endo/Heme/Allergies: Negative.   Psychiatric/Behavioral: Negative.     Objective:  Physical Exam  Constitutional: He is oriented to person, place, and time. He appears well-developed and well-nourished.  HENT:  Head: Normocephalic and atraumatic.  Eyes: EOM are normal. Pupils are equal, round, and reactive to light.  Neck: Neck supple.  Cardiovascular: Normal rate and intact distal pulses.   Respiratory: Effort normal.  GI: Soft.  Musculoskeletal:       Crepitus with ROM of right knee.  Varus deformity.  No edema or effusion of knee.  No ligamentous instability   Neurological:  He is alert and oriented to person, place, and time.  Skin: Skin is warm and dry.  Psychiatric: He has a normal mood and affect.    Vital signs in last 24 hours: Temp:  [97.8 F (36.6 C)] 97.8 F (36.6 C) (12/27 0622) Pulse Rate:  [63] 63  (12/27 0622) Resp:  [18] 18  (12/27 0622) BP: (166)/(63) 166/63 mmHg (12/27 0622) SpO2:  [97 %] 97 % (12/27 0622)  Labs:   Estimated Body mass index is 21.23 kg/(m^2) as calculated from the following:   Height as of 10/09/11: 5\' 11" (1.803 m).   Weight as of 10/09/11: 152 lb 3.2 oz(69.037 kg).   Imaging Review Plain radiographs demonstrate severe degenerative joint disease of the right knee(s). The overall alignment issignificant varus. The bone quality appears to be adequate for age and reported activity level.  Assessment/Plan:  End stage arthritis, right knee   The patient history, physical examination, clinical judgment of the provider and imaging studies are consistent with end stage degenerative joint disease of the right knee(s) and total knee arthroplasty is deemed medically necessary. The treatment options including medical management, injection therapy arthroscopy and arthroplasty were discussed at length. The risks and benefits of total knee arthroplasty were presented and reviewed. The risks due to aseptic loosening, infection, stiffness, patella tracking problems, thromboembolic complications and other imponderables were discussed. The patient acknowledged the explanation, agreed to proceed with the plan and consent was signed. Patient is being admitted for inpatient treatment for surgery, pain control, PT, OT, prophylactic antibiotics, VTE prophylaxis, progressive ambulation and ADL's and discharge planning. The patient is planning to be discharged home with home health services

## 2012-01-07 ENCOUNTER — Encounter (HOSPITAL_COMMUNITY): Payer: Self-pay

## 2012-01-07 ENCOUNTER — Encounter (HOSPITAL_COMMUNITY)
Admission: RE | Admit: 2012-01-07 | Discharge: 2012-01-07 | Disposition: A | Discharge status: Home / Self Care | Payer: Medicare Other | Source: Ambulatory Visit | Attending: Orthopaedic Surgery | Admitting: Orthopaedic Surgery

## 2012-01-07 ENCOUNTER — Ambulatory Visit (HOSPITAL_COMMUNITY)
Admission: RE | Admit: 2012-01-07 | Discharge: 2012-01-07 | Disposition: A | Discharge status: Home / Self Care | Payer: Medicare Other | Source: Ambulatory Visit | Attending: Orthopaedic Surgery | Admitting: Orthopaedic Surgery

## 2012-01-07 DIAGNOSIS — Z01810 Encounter for preprocedural cardiovascular examination: Secondary | ICD-10-CM | POA: Insufficient documentation

## 2012-01-07 HISTORY — DX: Urinary calculus, unspecified: N20.9

## 2012-01-07 HISTORY — DX: Cerebral infarction, unspecified: I63.9

## 2012-01-07 HISTORY — DX: Hypothyroidism, unspecified: E03.9

## 2012-01-07 HISTORY — DX: Gastro-esophageal reflux disease without esophagitis: K21.9

## 2012-01-07 HISTORY — DX: Unspecified osteoarthritis, unspecified site: M19.90

## 2012-01-07 LAB — URINE MICROSCOPIC-ADD ON

## 2012-01-07 LAB — COMPREHENSIVE METABOLIC PANEL
Alkaline Phosphatase: 62 U/L (ref 39–117)
BUN: 21 mg/dL (ref 6–23)
CO2: 27 mEq/L (ref 19–32)
Chloride: 103 mEq/L (ref 96–112)
GFR calc Af Amer: 70 mL/min — ABNORMAL LOW (ref >90)
GFR calc non Af Amer: 61 mL/min — ABNORMAL LOW (ref >90)
Glucose, Bld: 95 mg/dL (ref 70–99)
Potassium: 4 mEq/L (ref 3.5–5.1)
Total Bilirubin: 0.7 mg/dL (ref 0.3–1.2)

## 2012-01-07 LAB — URINALYSIS, ROUTINE W REFLEX MICROSCOPIC
Glucose, UA: NEGATIVE mg/dL
Ketones, ur: 15 mg/dL — AB
Nitrite: NEGATIVE
Protein, ur: 30 mg/dL — AB
Urobilinogen, UA: 1 mg/dL (ref 0.0–1.0)

## 2012-01-07 LAB — SURGICAL PCR SCREEN: Staphylococcus aureus: NEGATIVE

## 2012-01-07 LAB — ABO/RH: ABO/RH(D): AB POS

## 2012-01-07 LAB — CBC
HCT: 32.3 % — ABNORMAL LOW (ref 39.0–52.0)
Hemoglobin: 11.5 g/dL — ABNORMAL LOW (ref 13.0–17.0)
MCV: 101.9 fL — ABNORMAL HIGH (ref 78.0–100.0)
WBC: 5.5 10*3/uL (ref 4.0–10.5)

## 2012-01-07 NOTE — Pre-Procedure Instructions (Addendum)
22 Joe Soto  01/07/2012   Your procedure is scheduled on:  01/11/12  Report to [facility] at530 AM.  Call this number if you have problems the morning of surgery: [phone]   Remember:   Do not eat food:or drinkAfter Midnight.    Take these medicines the morning of surgery with A SIP OF WATER: inhalers, amlodipine, accolate,synthroid, omeprazole STOP fish oil and multi vit   Do not wear jewelry,   Do not wear lotions, powders, or perfumes. You may not wear deodorant.  Do not shave 48 hours prior to surgery. Men may shave face and neck.  Do not bring valuables to the hospital.  Contacts, dentures or bridgework may not be worn into surgery.  Leave suitcase in the car. After surgery it may be brought to your room.  For patients admitted to the hospital, checkout time is 11:00 AM the day of discharge.   Patients discharged the day of surgery will not be allowed to drive home.  Name and phone number of your driver:margaret wife [phone]  Special Instructions: Shower using CHG 2 nights before surgery and the night before surgery.  If you shower the day of surgery use CHG.  Use special wash - you have one bottle of CHG for all showers.  You should use approximately 1/3 of the bottle for each shower.   Please read over the following fact sheets that you were given: Pain Booklet, Coughing and Deep Breathing, Blood Transfusion Information, MRSA Information and Surgical Site Infection Prevention

## 2012-01-10 MED ORDER — CEFAZOLIN SODIUM-DEXTROSE 2-3 GM-% IV SOLR
2.0000 g | INTRAVENOUS | Status: AC
  Administered 2012-01-11: 2 g via INTRAVENOUS
  Filled 2012-01-10: qty 50

## 2012-01-11 ENCOUNTER — Inpatient Hospital Stay (HOSPITAL_COMMUNITY): Payer: Medicare Other | Admitting: Anesthesiology

## 2012-01-11 ENCOUNTER — Encounter (HOSPITAL_COMMUNITY)
Admission: RE | Disposition: A | Discharge status: Home Health | Payer: Self-pay | Source: Ambulatory Visit | Attending: Orthopaedic Surgery

## 2012-01-11 ENCOUNTER — Encounter (HOSPITAL_COMMUNITY): Payer: Self-pay | Admitting: Anesthesiology

## 2012-01-11 ENCOUNTER — Encounter (HOSPITAL_COMMUNITY): Payer: Self-pay | Admitting: *Deleted

## 2012-01-11 ENCOUNTER — Inpatient Hospital Stay (HOSPITAL_COMMUNITY)
Admission: RE | Admit: 2012-01-11 | Discharge: 2012-01-13 | DRG: 470 | Disposition: A | Discharge status: Home Health | Payer: Medicare Other | Source: Ambulatory Visit | Attending: Orthopaedic Surgery | Admitting: Orthopaedic Surgery

## 2012-01-11 DIAGNOSIS — M1711 Unilateral primary osteoarthritis, right knee: Secondary | ICD-10-CM | POA: Diagnosis present

## 2012-01-11 DIAGNOSIS — E785 Hyperlipidemia, unspecified: Secondary | ICD-10-CM | POA: Diagnosis present

## 2012-01-11 DIAGNOSIS — Z8673 Personal history of transient ischemic attack (TIA), and cerebral infarction without residual deficits: Secondary | ICD-10-CM

## 2012-01-11 DIAGNOSIS — D62 Acute posthemorrhagic anemia: Secondary | ICD-10-CM | POA: Diagnosis not present

## 2012-01-11 DIAGNOSIS — Z8546 Personal history of malignant neoplasm of prostate: Secondary | ICD-10-CM

## 2012-01-11 DIAGNOSIS — Z79899 Other long term (current) drug therapy: Secondary | ICD-10-CM

## 2012-01-11 DIAGNOSIS — J4489 Other specified chronic obstructive pulmonary disease: Secondary | ICD-10-CM | POA: Diagnosis present

## 2012-01-11 DIAGNOSIS — K219 Gastro-esophageal reflux disease without esophagitis: Secondary | ICD-10-CM | POA: Diagnosis present

## 2012-01-11 DIAGNOSIS — E039 Hypothyroidism, unspecified: Secondary | ICD-10-CM | POA: Diagnosis present

## 2012-01-11 DIAGNOSIS — J449 Chronic obstructive pulmonary disease, unspecified: Secondary | ICD-10-CM | POA: Diagnosis present

## 2012-01-11 DIAGNOSIS — I10 Essential (primary) hypertension: Secondary | ICD-10-CM | POA: Diagnosis present

## 2012-01-11 DIAGNOSIS — M1710 Unilateral primary osteoarthritis, unspecified knee: Principal | ICD-10-CM | POA: Diagnosis present

## 2012-01-11 DIAGNOSIS — M24569 Contracture, unspecified knee: Secondary | ICD-10-CM | POA: Diagnosis present

## 2012-01-11 HISTORY — PX: KNEE ARTHROPLASTY: SHX992

## 2012-01-11 SURGERY — ARTHROPLASTY, KNEE, TOTAL, USING IMAGELESS COMPUTER-ASSISTED NAVIGATION
Anesthesia: General | Site: Knee | Laterality: Right | Wound class: Clean

## 2012-01-11 MED ORDER — METHOCARBAMOL 100 MG/ML IJ SOLN
500.0000 mg | Freq: Four times a day (QID) | INTRAVENOUS | Status: DC | PRN
  Administered 2012-01-11: 500 mg via INTRAVENOUS
  Filled 2012-01-11: qty 5

## 2012-01-11 MED ORDER — KCL IN DEXTROSE-NACL 20-5-0.45 MEQ/L-%-% IV SOLN
INTRAVENOUS | Status: DC
  Administered 2012-01-11 – 2012-01-12 (×3): via INTRAVENOUS
  Filled 2012-01-11 (×6): qty 1000

## 2012-01-11 MED ORDER — PROPOFOL 10 MG/ML IV BOLUS
INTRAVENOUS | Status: DC | PRN
  Administered 2012-01-11: 180 mg via INTRAVENOUS

## 2012-01-11 MED ORDER — OXYCODONE HCL 5 MG PO TABS
5.0000 mg | ORAL_TABLET | ORAL | Status: DC | PRN
  Administered 2012-01-11 (×2): 5 mg via ORAL
  Administered 2012-01-12 (×2): 10 mg via ORAL
  Administered 2012-01-12 (×2): 5 mg via ORAL
  Administered 2012-01-12 – 2012-01-13 (×5): 10 mg via ORAL
  Administered 2012-01-13: 5 mg via ORAL
  Filled 2012-01-11: qty 1
  Filled 2012-01-11 (×3): qty 2
  Filled 2012-01-11: qty 1
  Filled 2012-01-11 (×2): qty 2
  Filled 2012-01-11 (×2): qty 1
  Filled 2012-01-11 (×2): qty 2
  Filled 2012-01-11: qty 1

## 2012-01-11 MED ORDER — OXYCODONE HCL 5 MG PO TABS: 5.0000 mg | ORAL_TABLET | Freq: Once | ORAL | Status: DC | PRN

## 2012-01-11 MED ORDER — HYDROMORPHONE HCL PF 1 MG/ML IJ SOLN
0.2500 mg | INTRAMUSCULAR | Status: DC | PRN
  Administered 2012-01-11 (×2): 0.5 mg via INTRAVENOUS

## 2012-01-11 MED ORDER — LACTATED RINGERS IV SOLN
INTRAVENOUS | Status: DC | PRN
  Administered 2012-01-11 (×2): via INTRAVENOUS

## 2012-01-11 MED ORDER — METOCLOPRAMIDE HCL 5 MG/ML IJ SOLN
5.0000 mg | Freq: Three times a day (TID) | INTRAMUSCULAR | Status: DC | PRN
  Administered 2012-01-11 – 2012-01-12 (×2): 10 mg via INTRAVENOUS
  Filled 2012-01-11 (×3): qty 2

## 2012-01-11 MED ORDER — MORPHINE SULFATE (PF) 1 MG/ML IV SOLN
INTRAVENOUS | Status: DC
  Administered 2012-01-11: 16 mg via INTRAVENOUS
  Administered 2012-01-11: 1 mg via INTRAVENOUS

## 2012-01-11 MED ORDER — LEVOTHYROXINE SODIUM 75 MCG PO TABS
75.0000 ug | ORAL_TABLET | Freq: Every day | ORAL | Status: DC
  Administered 2012-01-12 – 2012-01-13 (×2): 75 ug via ORAL
  Filled 2012-01-11 (×3): qty 1

## 2012-01-11 MED ORDER — PHENOL 1.4 % MT LIQD: 1.0000 | OROMUCOSAL | Status: DC | PRN

## 2012-01-11 MED ORDER — MOMETASONE FURO-FORMOTEROL FUM 200-5 MCG/ACT IN AERO
2.0000 | INHALATION_SPRAY | Freq: Two times a day (BID) | RESPIRATORY_TRACT | Status: DC
  Administered 2012-01-12 – 2012-01-13 (×2): 2 via RESPIRATORY_TRACT
  Filled 2012-01-11: qty 8.8

## 2012-01-11 MED ORDER — MIDAZOLAM HCL 5 MG/5ML IJ SOLN
INTRAMUSCULAR | Status: DC | PRN
  Administered 2012-01-11: 1 mg via INTRAVENOUS

## 2012-01-11 MED ORDER — MORPHINE SULFATE 2 MG/ML IJ SOLN
1.0000 mg | INTRAMUSCULAR | Status: DC | PRN
  Administered 2012-01-11 – 2012-01-12 (×3): 1 mg via INTRAVENOUS
  Filled 2012-01-11 (×2): qty 1

## 2012-01-11 MED ORDER — ONDANSETRON HCL 4 MG/2ML IJ SOLN: 4.0000 mg | Freq: Four times a day (QID) | INTRAMUSCULAR | Status: DC | PRN

## 2012-01-11 MED ORDER — GLYCOPYRROLATE 0.2 MG/ML IJ SOLN
INTRAMUSCULAR | Status: DC | PRN
  Administered 2012-01-11: .5 mg via INTRAVENOUS

## 2012-01-11 MED ORDER — METOCLOPRAMIDE HCL 10 MG PO TABS: 5.0000 mg | ORAL_TABLET | Freq: Three times a day (TID) | ORAL | Status: DC | PRN

## 2012-01-11 MED ORDER — ACETAMINOPHEN 650 MG RE SUPP: 650.0000 mg | Freq: Four times a day (QID) | RECTAL | Status: DC | PRN

## 2012-01-11 MED ORDER — SENNOSIDES-DOCUSATE SODIUM 8.6-50 MG PO TABS: 1.0000 | ORAL_TABLET | Freq: Every evening | ORAL | Status: DC | PRN

## 2012-01-11 MED ORDER — ALBUTEROL SULFATE HFA 108 (90 BASE) MCG/ACT IN AERS
2.0000 | INHALATION_SPRAY | Freq: Four times a day (QID) | RESPIRATORY_TRACT | Status: DC | PRN
  Filled 2012-01-11: qty 6.7

## 2012-01-11 MED ORDER — ASPIRIN EC 325 MG PO TBEC
325.0000 mg | DELAYED_RELEASE_TABLET | Freq: Every day | ORAL | Status: DC
  Administered 2012-01-12 – 2012-01-13 (×2): 325 mg via ORAL
  Filled 2012-01-11 (×3): qty 1

## 2012-01-11 MED ORDER — ACETAMINOPHEN 325 MG PO TABS: 650.0000 mg | ORAL_TABLET | Freq: Four times a day (QID) | ORAL | Status: DC | PRN

## 2012-01-11 MED ORDER — BUPIVACAINE HCL (PF) 0.5 % IJ SOLN
INTRAMUSCULAR | Status: DC | PRN
  Administered 2012-01-11: 25 mL

## 2012-01-11 MED ORDER — MONTELUKAST SODIUM 10 MG PO TABS
10.0000 mg | ORAL_TABLET | Freq: Every day | ORAL | Status: DC
  Administered 2012-01-11 – 2012-01-12 (×2): 10 mg via ORAL
  Filled 2012-01-11 (×3): qty 1

## 2012-01-11 MED ORDER — OXYCODONE HCL 5 MG/5ML PO SOLN: 5.0000 mg | Freq: Once | ORAL | Status: DC | PRN

## 2012-01-11 MED ORDER — METOCLOPRAMIDE HCL 5 MG/ML IJ SOLN: 10.0000 mg | Freq: Once | INTRAMUSCULAR | Status: DC | PRN

## 2012-01-11 MED ORDER — BISACODYL 10 MG RE SUPP: 10.0000 mg | Freq: Every day | RECTAL | Status: DC | PRN

## 2012-01-11 MED ORDER — NALOXONE HCL 0.4 MG/ML IJ SOLN: 0.4000 mg | INTRAMUSCULAR | Status: DC | PRN

## 2012-01-11 MED ORDER — EPHEDRINE SULFATE 50 MG/ML IJ SOLN
INTRAMUSCULAR | Status: DC | PRN
  Administered 2012-01-11: 10 mg via INTRAVENOUS
  Administered 2012-01-11: 15 mg via INTRAVENOUS

## 2012-01-11 MED ORDER — ROCURONIUM BROMIDE 100 MG/10ML IV SOLN
INTRAVENOUS | Status: DC | PRN
  Administered 2012-01-11: 50 mg via INTRAVENOUS

## 2012-01-11 MED ORDER — METHOCARBAMOL 500 MG PO TABS
500.0000 mg | ORAL_TABLET | Freq: Four times a day (QID) | ORAL | Status: DC | PRN
  Administered 2012-01-12 – 2012-01-13 (×2): 500 mg via ORAL
  Filled 2012-01-11 (×4): qty 1

## 2012-01-11 MED ORDER — AMLODIPINE BESYLATE 5 MG PO TABS
5.0000 mg | ORAL_TABLET | Freq: Every day | ORAL | Status: DC
  Administered 2012-01-13: 5 mg via ORAL
  Filled 2012-01-11 (×2): qty 1

## 2012-01-11 MED ORDER — ZOLPIDEM TARTRATE 5 MG PO TABS: 5.0000 mg | ORAL_TABLET | Freq: Every evening | ORAL | Status: DC | PRN

## 2012-01-11 MED ORDER — PANTOPRAZOLE SODIUM 40 MG PO TBEC
80.0000 mg | DELAYED_RELEASE_TABLET | Freq: Every day | ORAL | Status: DC
  Filled 2012-01-11: qty 2

## 2012-01-11 MED ORDER — SODIUM CHLORIDE 0.9 % IJ SOLN: 9.0000 mL | INTRAMUSCULAR | Status: DC | PRN

## 2012-01-11 MED ORDER — ARTIFICIAL TEARS OP OINT
TOPICAL_OINTMENT | OPHTHALMIC | Status: DC | PRN
  Administered 2012-01-11: 1 via OPHTHALMIC

## 2012-01-11 MED ORDER — VITAMIN D3 25 MCG (1000 UNIT) PO TABS
2000.0000 [IU] | ORAL_TABLET | Freq: Every day | ORAL | Status: DC
  Administered 2012-01-13: 2000 [IU] via ORAL
  Filled 2012-01-11 (×2): qty 2

## 2012-01-11 MED ORDER — MENTHOL 3 MG MT LOZG: 1.0000 | LOZENGE | OROMUCOSAL | Status: DC | PRN

## 2012-01-11 MED ORDER — FLEET ENEMA 7-19 GM/118ML RE ENEM: 1.0000 | ENEMA | Freq: Once | RECTAL | Status: AC | PRN

## 2012-01-11 MED ORDER — DIPHENHYDRAMINE HCL 12.5 MG/5ML PO ELIX: 12.5000 mg | ORAL_SOLUTION | Freq: Four times a day (QID) | ORAL | Status: DC | PRN

## 2012-01-11 MED ORDER — SODIUM CHLORIDE 0.9 % IR SOLN
Status: DC | PRN
  Administered 2012-01-11: 3000 mL

## 2012-01-11 MED ORDER — CEFAZOLIN SODIUM 1-5 GM-% IV SOLN
1.0000 g | Freq: Four times a day (QID) | INTRAVENOUS | Status: AC
  Administered 2012-01-11 (×2): 1 g via INTRAVENOUS
  Filled 2012-01-11 (×2): qty 50

## 2012-01-11 MED ORDER — HYDROMORPHONE HCL PF 1 MG/ML IJ SOLN
INTRAMUSCULAR | Status: AC
  Filled 2012-01-11: qty 1

## 2012-01-11 MED ORDER — MORPHINE SULFATE (PF) 1 MG/ML IV SOLN
INTRAVENOUS | Status: AC
  Filled 2012-01-11: qty 25

## 2012-01-11 MED ORDER — DOCUSATE SODIUM 100 MG PO CAPS
100.0000 mg | ORAL_CAPSULE | Freq: Two times a day (BID) | ORAL | Status: DC
  Administered 2012-01-11 – 2012-01-12 (×2): 100 mg via ORAL
  Filled 2012-01-11 (×3): qty 1

## 2012-01-11 MED ORDER — NEOSTIGMINE METHYLSULFATE 1 MG/ML IJ SOLN
INTRAMUSCULAR | Status: DC | PRN
  Administered 2012-01-11: 2.5 mg via INTRAVENOUS

## 2012-01-11 MED ORDER — LIDOCAINE HCL (CARDIAC) 20 MG/ML IV SOLN
INTRAVENOUS | Status: DC | PRN
  Administered 2012-01-11: 80 mg via INTRAVENOUS

## 2012-01-11 MED ORDER — ONDANSETRON HCL 4 MG/2ML IJ SOLN
4.0000 mg | Freq: Four times a day (QID) | INTRAMUSCULAR | Status: DC | PRN
  Administered 2012-01-11 – 2012-01-12 (×2): 4 mg via INTRAVENOUS
  Filled 2012-01-11 (×3): qty 2

## 2012-01-11 MED ORDER — DIPHENHYDRAMINE HCL 50 MG/ML IJ SOLN: 12.5000 mg | Freq: Four times a day (QID) | INTRAMUSCULAR | Status: DC | PRN

## 2012-01-11 MED ORDER — ONDANSETRON HCL 4 MG/2ML IJ SOLN
INTRAMUSCULAR | Status: DC | PRN
  Administered 2012-01-11: 4 mg via INTRAVENOUS

## 2012-01-11 MED ORDER — ADULT MULTIVITAMIN W/MINERALS CH
1.0000 | ORAL_TABLET | Freq: Every day | ORAL | Status: DC
  Administered 2012-01-13: 1 via ORAL
  Filled 2012-01-11 (×3): qty 1

## 2012-01-11 MED ORDER — FENTANYL CITRATE 0.05 MG/ML IJ SOLN
INTRAMUSCULAR | Status: DC | PRN
  Administered 2012-01-11 (×5): 50 ug via INTRAVENOUS

## 2012-01-11 MED ORDER — ONDANSETRON HCL 4 MG PO TABS: 4.0000 mg | ORAL_TABLET | Freq: Four times a day (QID) | ORAL | Status: DC | PRN

## 2012-01-11 SURGICAL SUPPLY — 64 items
BANDAGE ELASTIC 4 VELCRO ST LF (GAUZE/BANDAGES/DRESSINGS) ×2 IMPLANT
BANDAGE ELASTIC 6 VELCRO ST LF (GAUZE/BANDAGES/DRESSINGS) ×2 IMPLANT
BANDAGE ESMARK 6X9 LF (GAUZE/BANDAGES/DRESSINGS) ×1 IMPLANT
BENZOIN TINCTURE PRP APPL 2/3 (GAUZE/BANDAGES/DRESSINGS) ×2 IMPLANT
BLADE SAGITTAL 25.0X1.19X90 (BLADE) ×2 IMPLANT
BLADE SAW SGTL 13X75X1.27 (BLADE) ×2 IMPLANT
BNDG ELASTIC 6X10 VLCR STRL LF (GAUZE/BANDAGES/DRESSINGS) ×2 IMPLANT
BNDG ESMARK 6X9 LF (GAUZE/BANDAGES/DRESSINGS) ×2
BOWL SMART MIX CTS (DISPOSABLE) ×2 IMPLANT
CEMENT HV SMART SET (Cement) ×4 IMPLANT
CLOTH BEACON ORANGE TIMEOUT ST (SAFETY) ×2 IMPLANT
CLSR STERI-STRIP ANTIMIC 1/2X4 (GAUZE/BANDAGES/DRESSINGS) ×2 IMPLANT
COVER BACK TABLE 24X17X13 BIG (DRAPES) IMPLANT
COVER SURGICAL LIGHT HANDLE (MISCELLANEOUS) ×2 IMPLANT
CUFF TOURNIQUET SINGLE 34IN LL (TOURNIQUET CUFF) ×2 IMPLANT
CUFF TOURNIQUET SINGLE 44IN (TOURNIQUET CUFF) IMPLANT
DRAPE ORTHO SPLIT 77X108 STRL (DRAPES) ×2
DRAPE SURG ORHT 6 SPLT 77X108 (DRAPES) ×2 IMPLANT
DRAPE U-SHAPE 47X51 STRL (DRAPES) ×2 IMPLANT
DRSG PAD ABDOMINAL 8X10 ST (GAUZE/BANDAGES/DRESSINGS) ×4 IMPLANT
DURAPREP 26ML APPLICATOR (WOUND CARE) ×2 IMPLANT
ELECT REM PT RETURN 9FT ADLT (ELECTROSURGICAL) ×2
ELECTRODE REM PT RTRN 9FT ADLT (ELECTROSURGICAL) ×1 IMPLANT
FACESHIELD LNG OPTICON STERILE (SAFETY) ×2 IMPLANT
GAUZE XEROFORM 5X9 LF (GAUZE/BANDAGES/DRESSINGS) ×2 IMPLANT
GLOVE BIOGEL PI IND STRL 7.5 (GLOVE) ×1 IMPLANT
GLOVE BIOGEL PI IND STRL 8 (GLOVE) ×1 IMPLANT
GLOVE BIOGEL PI INDICATOR 7.5 (GLOVE) ×1
GLOVE BIOGEL PI INDICATOR 8 (GLOVE) ×1
GLOVE ECLIPSE 7.0 STRL STRAW (GLOVE) ×2 IMPLANT
GLOVE ORTHO TXT STRL SZ7.5 (GLOVE) ×2 IMPLANT
GOWN PREVENTION PLUS LG XLONG (DISPOSABLE) IMPLANT
GOWN PREVENTION PLUS XLARGE (GOWN DISPOSABLE) ×2 IMPLANT
GOWN STRL NON-REIN LRG LVL3 (GOWN DISPOSABLE) ×6 IMPLANT
HANDPIECE INTERPULSE COAX TIP (DISPOSABLE) ×1
IMMOBILIZER KNEE 22 UNIV (SOFTGOODS) ×2 IMPLANT
KIT BASIN OR (CUSTOM PROCEDURE TRAY) ×2 IMPLANT
KIT ROOM TURNOVER OR (KITS) ×2 IMPLANT
MANIFOLD NEPTUNE II (INSTRUMENTS) ×2 IMPLANT
MARKER SPHERE PSV REFLC THRD 5 (MARKER) ×6 IMPLANT
NEEDLE 1/2 CIR MAYO (NEEDLE) ×2 IMPLANT
NEEDLE HYPO 25GX1X1/2 BEV (NEEDLE) ×2 IMPLANT
NS IRRIG 1000ML POUR BTL (IV SOLUTION) ×2 IMPLANT
PACK TOTAL JOINT (CUSTOM PROCEDURE TRAY) ×2 IMPLANT
PAD ARMBOARD 7.5X6 YLW CONV (MISCELLANEOUS) ×4 IMPLANT
PAD CAST 4YDX4 CTTN HI CHSV (CAST SUPPLIES) ×1 IMPLANT
PADDING CAST COTTON 4X4 STRL (CAST SUPPLIES) ×1
PADDING CAST COTTON 6X4 STRL (CAST SUPPLIES) ×2 IMPLANT
PIN SCHANZ 4MM 130MM (PIN) ×8 IMPLANT
SET HNDPC FAN SPRY TIP SCT (DISPOSABLE) ×1 IMPLANT
SPONGE GAUZE 4X4 12PLY (GAUZE/BANDAGES/DRESSINGS) ×2 IMPLANT
STAPLER VISISTAT 35W (STAPLE) ×2 IMPLANT
STRIP CLOSURE SKIN 1/2X4 (GAUZE/BANDAGES/DRESSINGS) ×4 IMPLANT
SUCTION FRAZIER TIP 10 FR DISP (SUCTIONS) ×2 IMPLANT
SUT ETHIBOND NAB CT1 #1 30IN (SUTURE) ×4 IMPLANT
SUT VIC AB 2-0 CT1 27 (SUTURE) ×2
SUT VIC AB 2-0 CT1 TAPERPNT 27 (SUTURE) ×2 IMPLANT
SUT VICRYL 4-0 PS2 18IN ABS (SUTURE) ×2 IMPLANT
SUT VICRYL AB 2 0 TIES (SUTURE) ×2 IMPLANT
SYR CONTROL 10ML LL (SYRINGE) ×2 IMPLANT
TOWEL OR 17X24 6PK STRL BLUE (TOWEL DISPOSABLE) ×2 IMPLANT
TOWEL OR 17X26 10 PK STRL BLUE (TOWEL DISPOSABLE) ×2 IMPLANT
TRAY FOLEY CATH 14FR (SET/KITS/TRAYS/PACK) ×2 IMPLANT
WATER STERILE IRR 1000ML POUR (IV SOLUTION) ×6 IMPLANT

## 2012-01-11 NOTE — Preoperative (Signed)
Beta Blockers   Reason not to administer Beta Blockers:Not Applicable 

## 2012-01-11 NOTE — Progress Notes (Signed)
Orthopedic Tech Progress Note Patient Details:  Joe Soto [birth year] [id number] Note that patient was issued knee immobilizer after surgery.  Patient ID: [name], male   DOB: Dec 17, 1923, 78 y.o.   MRN: [id number]   Orie Rout 01/11/2012, 11:13 AM

## 2012-01-11 NOTE — Brief Op Note (Cosign Needed Addendum)
01/11/2012  9:56 AM  PATIENT:  Joe Soto  78 y.o. male  PRE-OPERATIVE DIAGNOSIS:  Right knee osteoarthritis  POST-OPERATIVE DIAGNOSIS:  Right knee osteoarthritis  PROCEDURE:  Procedure(s) (LRB) with comments: COMPUTER ASSISTED TOTAL KNEE ARTHROPLASTY (Right) - Right total knee arthroplasty - cemented  SURGEON:  Surgeon(s) and Role:    * Eldred Manges, MD - Primary  PHYSICIAN ASSISTANT:Amiaya Mcneeley Marita [place] PAC   ASSISTANTS: none   ANESTHESIA:   regional and general  EBL:  Total I/O In: 1700 [I.V.:1700] Out: 50 [Urine:50]  BLOOD ADMINISTERED:none  DRAINS: none   LOCAL MEDICATIONS USED:  NONE  SPECIMEN:  No Specimen  DISPOSITION OF SPECIMEN:  N/A  COUNTS:  YES  TOURNIQUET:   Total Tourniquet Time Documented: Thigh (Right) - 69 minutes  DICTATION: .Note written in EPIC  PLAN OF CARE: Admit to inpatient   PATIENT DISPOSITION:  PACU - hemodynamically stable.   Delay start of Pharmacological VTE agent (>24hrs) due to surgical blood loss or risk of bleeding: no

## 2012-01-11 NOTE — Evaluation (Signed)
Physical Therapy Evaluation Patient Details Name: Joe Soto MRN: [id number] DOB: Aug 17, 1923 Today's Date: 01/11/2012 Time: 4098-1191 PT Time Calculation (min): 46 min  PT Assessment / Plan / Recommendation Clinical Impression  Pt is an 78 y/o male s/p R TKA.     PT Assessment  Patient needs continued PT services    Follow Up Recommendations  Home health PT    Does the patient have the potential to tolerate intense rehabilitation      Barriers to Discharge None NONE    Equipment Recommendations  Rolling walker with 5" wheels;Other (comment) (3 in 1 )    Recommendations for Other Services     Frequency 7X/week    Precautions / Restrictions Precautions Precautions: Knee Precaution Comments: Educated pt and daughter in knee positioning to promte knee extension.   Required Braces or Orthoses: Knee Immobilizer - Right Knee Immobilizer - Right: On except when in CPM Restrictions Weight Bearing Restrictions: Yes RLE Weight Bearing: Weight bearing as tolerated   Pertinent Vitals/Pain 5/10 pain at rest.  8/10 pain at end of session with knee in terminal extension.  RN made aware of pt desire for pain medication.        Mobility  Bed Mobility Bed Mobility: Supine to Sit;Sitting - Scoot to Edge of Bed Supine to Sit: 4: Min guard Sitting - Scoot to [place] of Bed: 4: Min guard Details for Bed Mobility Assistance: Cues for technique.  No assist required for RLE with KI on.  Transfers Transfers: Sit to Stand;Stand to Sit Sit to Stand: 4: Min guard;From bed;With upper extremity assist Stand to Sit: 4: Min assist;To chair/3-in-1;With upper extremity assist Details for Transfer Assistance: Verbal cueing for hand placement, Assist for R LE positioning to sit.  Ambulation/Gait Ambulation/Gait Assistance: 4: Min guard Ambulation Distance (Feet): 10 Feet Assistive device: Rolling walker Ambulation/Gait Assistance Details: Cues for gait sequencing  Gait Pattern: Step-to  pattern Stairs: No    Shoulder Instructions     Exercises Total Joint Exercises Ankle Circles/Pumps: 10 reps;Both   PT Diagnosis: Difficulty walking;Acute pain  PT Problem List: Decreased activity tolerance;Decreased mobility;Decreased knowledge of use of DME;Decreased knowledge of precautions;Pain;Decreased range of motion PT Treatment Interventions: Gait training;Stair training;DME instruction;Functional mobility training;Therapeutic activities;Therapeutic exercise;Patient/family education   PT Goals Acute Rehab PT Goals PT Goal Formulation: With patient Time For Goal Achievement: 01/18/12 Potential to Achieve Goals: Good Pt will go Supine/Side to Sit: Independently PT Goal: Supine/Side to Sit - Progress: Goal set today Pt will go Sit to Supine/Side: Independently PT Goal: Sit to Supine/Side - Progress: Goal set today Pt will Transfer Bed to Chair/Chair to Bed: with modified independence PT Transfer Goal: Bed to Chair/Chair to Bed - Progress: Goal set today Pt will Ambulate: >150 feet;with modified independence;with rolling walker PT Goal: Ambulate - Progress: Goal set today Pt will Go Up / Down Stairs: 6-9 stairs;with supervision;with least restrictive assistive device PT Goal: Up/Down Stairs - Progress: Goal set today Pt will Perform Home Exercise Program: Independently PT Goal: Perform Home Exercise Program - Progress: Goal set today  Visit Information  Last PT Received On: 01/11/12 Assistance Needed: +1    Subjective Data  Subjective: Agree to PT eval  Patient Stated Goal: Walk without pain.    Prior Functioning  Home Living Lives With: Spouse Available Help at Discharge: Family;Available 24 hours/day Type of Home: House Home Access: Stairs to enter [employer] of Steps: 8 Entrance Stairs-Rails: Left Home Layout: Two level;Able to live on main level with bedroom/bathroom  Home Adaptive Equipment: Crutches Prior Function Level of Independence:  Independent Able to Take Stairs?: Yes Driving: Yes Vocation: Retired Musician: H[employer] Dominant Hand: Right    Cognition  Overall Cognitive Status: Appears within functional limits for tasks assessed/performed Arousal/Alertness: Awake/alert Orientation Level: Appears intact for tasks assessed Behavior During Session: [facility] for tasks performed    Extremity/Trunk Assessment Right Upper Extremity Assessment RUE ROM/Strength/Tone: Within functional levels Left Upper Extremity Assessment LUE ROM/Strength/Tone: Within functional levels Right Lower Extremity Assessment RLE ROM/Strength/Tone: WFL for tasks assessed Left Lower Extremity Assessment LLE ROM/Strength/Tone: WFL for tasks assessed Trunk Assessment Trunk Assessment: Normal   Balance Balance Balance Assessed: Yes Static Sitting Balance Static Sitting - Balance Support: Feet supported Static Sitting - Level of Assistance: 5: Stand by assistance Static Sitting - Comment/# of Minutes: stand by assist secondary to inital c/o dizziness  End of Session PT - End of Session Equipment Utilized During Treatment: Gait belt;Right knee immobilizer Activity Tolerance: Patient tolerated treatment well;Other (comment) (nauseated. ) Patient left: in chair;with call bell/phone within reach;with family/visitor present Nurse Communication: Mobility status;Patient requests pain meds CPM Right Knee CPM Right Knee: Off Additional Comments: CPM off at 1900  GP     Joe Soto 01/11/2012, 8:03 PM  Joe Soto L. Tayshawn Purnell DPT [phone]

## 2012-01-11 NOTE — Anesthesia Postprocedure Evaluation (Signed)
Anesthesia Post Note  Patient: Joe Soto  Procedure(s) Performed: Procedure(s) (LRB): COMPUTER ASSISTED TOTAL KNEE ARTHROPLASTY (Right)  Anesthesia type: general  Patient location: PACU  Post pain: Pain level controlled  Post assessment: Patient's Cardiovascular Status Stable  Last Vitals:  Filed Vitals:   01/11/12 1053  BP: 148/42  Pulse: 76  Temp:   Resp: 13    Post vital signs: Reviewed and stable  Level of consciousness: sedated  Complications: No apparent anesthesia complications

## 2012-01-11 NOTE — Anesthesia Procedure Notes (Addendum)
Anesthesia Regional Block:  Femoral nerve block  Pre-Anesthetic Checklist: ,, timeout performed, Correct Patient, Correct Site, Correct Laterality, Correct Procedure, Correct Position, site marked, Risks and benefits discussed,  Surgical consent,  Pre-op evaluation,  At surgeon's request and post-op pain management  Laterality: Right  Prep: chloraprep       Needles:   Needle Type: Other     Needle Length: 9cm  Needle Gauge: 21    Additional Needles:  Procedures: ultrasound guided (picture in chart) Femoral nerve block Narrative:  Start time: 01/11/2012 7:00 AM End time: 01/11/2012 7:08 AM Injection made incrementally with aspirations every 5 mL.  Performed by: Personally  Anesthesiologist: C. Frederick MD  Additional Notes: Ultrasound guidance used to: id relevant anatomy, confirm needle position, local anesthetic spread, avoidance of vascular puncture. Picture saved. No complications. Block performed personally by Janetta Hora. Gelene Mink, MD    Femoral nerve block Procedure Name: Intubation Date/Time: 01/11/2012 7:43 AM Performed by: Quentin Ore Pre-anesthesia Checklist: Patient identified, Emergency Drugs available, Suction available, Patient being monitored and Timeout performed Patient Re-evaluated:Patient Re-evaluated prior to inductionOxygen Delivery Method: Circle system utilized Preoxygenation: Pre-oxygenation with 100% oxygen Intubation Type: IV induction Ventilation: Mask ventilation without difficulty Laryngoscope Size: Mac and 3 Grade View: Grade II Tube type: Oral Tube size: 7.5 mm Number of attempts: 1 Airway Equipment and Method: Stylet Placement Confirmation: ETT inserted through vocal cords under direct vision,  positive ETCO2 and breath sounds checked- equal and bilateral Secured at: 23 cm Tube secured with: Tape Dental Injury: Teeth and Oropharynx as per pre-operative assessment

## 2012-01-11 NOTE — Interval H&P Note (Signed)
History and Physical Interval Note:  01/11/2012 7:10 AM  Joe Soto  has presented today for surgery, with the diagnosis of Right knee osteoarthritis  The various methods of treatment have been discussed with the patient and family. After consideration of risks, benefits and other options for treatment, the patient has consented to  Procedure(s) (LRB) with comments: COMPUTER ASSISTED TOTAL KNEE ARTHROPLASTY (Right) - Right total knee arthroplasty - cemented as a surgical intervention .  The patient's history has been reviewed, patient examined, no change in status, stable for surgery.  I have reviewed the patient's chart and labs.  Questions were answered to the patient's satisfaction.     [name] C

## 2012-01-11 NOTE — Progress Notes (Signed)
Orthopedic Tech Progress Note Patient Details:  Joe Soto 1923-05-18 [id number] CPM applied to Right LE with appropriate settings. OHF applied to patient's bed.  CPM Right Knee CPM Right Knee: On Right Knee Flexion (Degrees): 60  Right Knee Extension (Degrees): 0    Asia R Thompson 01/11/2012, 11:12 AM

## 2012-01-11 NOTE — Transfer of Care (Signed)
Immediate Anesthesia Transfer of Care Note  Patient: Joe Soto  Procedure(s) Performed: Procedure(s) (LRB) with comments: COMPUTER ASSISTED TOTAL KNEE ARTHROPLASTY (Right) - Right total knee arthroplasty - cemented  Patient Location: PACU  Anesthesia Type:General  Level of Consciousness: awake, alert  and oriented  Airway & Oxygen Therapy: Patient Spontanous Breathing and Patient connected to nasal cannula oxygen  Post-op Assessment: Report given to PACU RN, Post -op Vital signs reviewed and stable and Patient moving all extremities X 4  Post vital signs: Reviewed and stable  Complications: No apparent anesthesia complications

## 2012-01-11 NOTE — Progress Notes (Signed)
Utilization review completed.  

## 2012-01-11 NOTE — Anesthesia Preprocedure Evaluation (Addendum)
Anesthesia Evaluation  Patient identified by MRN, date of birth, ID band Patient awake    Reviewed: Allergy & Precautions, H&P , NPO status , Patient's Chart, lab work & pertinent test results, reviewed documented beta blocker date and time   Airway Mallampati: II TM Distance: >3 FB Neck ROM: full    Dental  (+) Partial Upper   Pulmonary asthma , COPD COPD inhaler,  breath sounds clear to auscultation        Cardiovascular hypertension, On Medications Rhythm:regular     Neuro/Psych CVA negative psych ROS   GI/Hepatic Neg liver ROS, GERD-  Medicated and Controlled,  Endo/Other  Hypothyroidism   Renal/GU negative Renal ROS  negative genitourinary   Musculoskeletal   Abdominal   Peds  Hematology negative hematology ROS (+)   Anesthesia Other Findings See surgeon's H&P   Reproductive/Obstetrics negative OB ROS                          Anesthesia Physical Anesthesia Plan  ASA: III  Anesthesia Plan: General   Post-op Pain Management:    Induction: Intravenous  Airway Management Planned: Oral ETT  Additional Equipment:   Intra-op Plan:   Post-operative Plan: Extubation in OR  Informed Consent: I have reviewed the patients History and Physical, chart, labs and discussed the procedure including the risks, benefits and alternatives for the proposed anesthesia with the patient or authorized representative who has indicated his/her understanding and acceptance.   Dental Advisory Given  Plan Discussed with: CRNA and Surgeon  Anesthesia Plan Comments:         Anesthesia Quick Evaluation

## 2012-01-11 NOTE — Op Note (Signed)
Test test  Preop diagnosis: Right knee osteoarthritis  Postop diagnosis: Right knee osteoarthritis with varus deformity and flexion contracture.  Procedure: Right cemented total knee arthroplasty, computer-assisted  Surgeon: Annell Greening M.D.  Assistant: Maud Deed PA-C medically necessary and present for the entire procedure  Anesthesia GLT plus preoperative femoral nerve block  Tourniquet time: 1 hour 7 minutes x350  Drains: None  Complications: None  Components Depuy  J and J. rotating platform, #4 femur #4 tibia. Femur with lugs 10 mm rotating platform Polly 38 mm 3 peg patella.  Procedure: After induction general anesthesia with preoperative femoral nerve block proximal leg tourniquet a lateral post he'll prepping was performed with DuraPrep the usual extremity sheets drapes total knee split sheets sterile skin marker Betadine Steri-Drape impervious stockinette Caban were applied timeout procedure was completed and leg was then wrapped an Esmarch tourniquet was inflated Ancef was given prophylactically.  Midline incision was made superficial retinaculum was developed entry retinaculum was opened patella was everted 10 mm were removed going from facet facet. 30 mm sizing was appropriate and holes were drilled. Large spurs removed off femur there was eburnated bone with a warned groove on the medial femoral condyle with flattening and and were inserted and then the femur inside the incision stab incision x2 in the tibia for the computer pins in the mid-tibia region. Equities trader generated patient had 9 flexion contracture and 12.5 of varus. Medial collateral ligament was tight.  9 mm resected off the distal femur verification chamfer cuts box cut was not made until tibia was cut a millimeters was recently taken and this did not resect the tear in the posterior medial aspect of the tibial plateau where there was erosive where. The tendon it was moved and 2 mm and then I resected.  Box cut was made after keel cut was made. There was good correction of the varus deformity medial side was too tight. All components removed and patient still had some degrees of flexion contracture. A large loose bodies removed posteriorly the rest of the PCL was completely resected spurs removed off the posterior femur three-quarter curved osteotome and posterior medial corner was released with the three-quarter curved osteotome as well as a medial collateral ligaments off the tibia. This led 1 mm to 1.5 difference in mediolateral. There was good symmetry in flexion and the knee was able, without 21 of extension. Bone was removed post lavage was used Vacumix and in the cement implantation cementing of the tibia followed by femur placed on the 10 mm #4 rotating platform polyethylene spacer and then patello-to sublux clamp. Cement was hard 15 minutes excess cement all been removed. 12 lavage used again tourniquet was deflated with cement was hard after 15 minutes hemostasis obtained in standard closure with the #1 nonabsorbable the deep layer quad tendon was put between the medial one third lateral two thirds. Proximal 1 cm of the patella had peeled off the tibial tubercle and some holes were drilled in the tibial tibial tubercle units of bleeding and then with closure and sutured in adjacent soft tissue. 2-0 Vicryl subtendinous tissue for Vicryl subcuticular closure and forsythia and the 2 stab incisions with the tibial pins. Patient saw the procedure well transferred to her room in stable condition after postop dressing and knee immobilizer. Tincture benzoin Steri-Strips applied after subcuticular closure for skin. Suture was as planned. Signed     Annell Greening M.D.

## 2012-01-12 LAB — BASIC METABOLIC PANEL
BUN: 13 mg/dL (ref 6–23)
CO2: 27 mEq/L (ref 19–32)
Calcium: 8.1 mg/dL — ABNORMAL LOW (ref 8.4–10.5)
Chloride: 102 mEq/L (ref 96–112)
Creatinine, Ser: 0.95 mg/dL (ref 0.50–1.35)
GFR calc Af Amer: 85 mL/min — ABNORMAL LOW (ref >90)
GFR calc non Af Amer: 73 mL/min — ABNORMAL LOW (ref >90)
Glucose, Bld: 152 mg/dL — ABNORMAL HIGH (ref 70–99)
Potassium: 4.5 mEq/L (ref 3.5–5.1)
Sodium: 135 mEq/L (ref 135–145)

## 2012-01-12 LAB — CBC
HCT: 26.9 % — ABNORMAL LOW (ref 39.0–52.0)
Hemoglobin: 9.8 g/dL — ABNORMAL LOW (ref 13.0–17.0)
MCH: 36.6 pg — ABNORMAL HIGH (ref 26.0–34.0)
MCHC: 36.4 g/dL — ABNORMAL HIGH (ref 30.0–36.0)
MCV: 100.4 fL — ABNORMAL HIGH (ref 78.0–100.0)
Platelets: 181 10*3/uL (ref 150–400)
RBC: 2.68 MIL/uL — ABNORMAL LOW (ref 4.22–5.81)
RDW: 12.8 % (ref 11.5–15.5)
WBC: 6.2 10*3/uL (ref 4.0–10.5)

## 2012-01-12 NOTE — Progress Notes (Signed)
Physical Therapy Treatment Patient Details Name: Joe Soto MRN: [id number] DOB: [birth year] Today's Date: 01/12/2012 Time: 8295-6213 PT Time Calculation (min): 54 min  PT Assessment / Plan / Recommendation Comments on Treatment Session  Pt c/o difficulty tolerating CPM this morning. Pt's daughter reporting pt unable to tolerate CPM any > 42 degrees.  Peformed manual joint mobs for 5 min increasing degree of flexion after each minute.  Pt able to achieve 65 degrees of R knee flexion with moderate discomfort.  Applied ice pack to knee and positioned in terminal knee extension.   AAROM in R knee 5 degrees short of full extension to 65 degrees of flexion.      Follow Up Recommendations  Home health PT     Does the patient have the potential to tolerate intense rehabilitation   yes  Barriers to Discharge  none      Equipment Recommendations  Rolling walker with 5" wheels;Other (comment)    Recommendations for Other Services  none  Frequency 7X/week   Plan Discharge plan remains appropriate;Frequency remains appropriate    Precautions / Restrictions Precautions Precautions: Knee Required Braces or Orthoses: Knee Immobilizer - Right Knee Immobilizer - Right: On except when in CPM Restrictions Weight Bearing Restrictions: Yes RLE Weight Bearing: Weight bearing as tolerated   Pertinent Vitals/Pain 8/10 pain in knee prior to session. 6/10 at end of session.  Pt medicated prior to session, applied ice and performed manual joint mobs for pain relief.     Mobility  Bed Mobility Bed Mobility: Supine to Sit;Sitting - Scoot to Edge of Bed Supine to Sit: 5: Supervision;HOB flat Sitting - Scoot to Edge of Bed: 5: Supervision Details for Bed Mobility Assistance: Cues for technique.  No assist required for RLE with KI on.  Transfers Transfers: Sit to Stand;Stand to Sit Sit to Stand: 5: Supervision;With upper extremity assist;From elevated surface;From bed Stand to Sit: 5: Supervision;To  chair/3-in-1;With upper extremity assist Details for Transfer Assistance: Verbal cues for hand placement and Positioning of R LE. Ambulation/Gait Ambulation/Gait Assistance: 5: Supervision Ambulation Distance (Feet): 80 Feet Assistive device: Rolling walker Ambulation/Gait Assistance Details: Cues to increase step length and gait speed.   Gait Pattern: Step-to pattern General Gait Details: Not able to tolerate full wt on R LE secondary to pain.  Stairs: No    Exercises Total Joint Exercises Heel Slides: AAROM;5 reps;Right   PT Diagnosis:    PT Problem List:   PT Treatment Interventions:     PT Goals Acute Rehab PT Goals PT Goal Formulation: With patient Time For Goal Achievement: 01/18/12 Potential to Achieve Goals: Good Pt will go Supine/Side to Sit: Independently PT Goal: Supine/Side to Sit - Progress: Progressing toward goal Pt will go Sit to Supine/Side: Independently PT Goal: Sit to Supine/Side - Progress: Progressing toward goal Pt will Transfer Bed to Chair/Chair to Bed: with modified independence PT Transfer Goal: Bed to Chair/Chair to Bed - Progress: Progressing toward goal Pt will Ambulate: >150 feet;with modified independence;with rolling walker PT Goal: Ambulate - Progress: Progressing toward goal Pt will Go Up / Down Stairs: 6-9 stairs;with supervision;with least restrictive assistive device Pt will Perform Home Exercise Program: Independently  Visit Information  Last PT Received On: 01/12/12 Assistance Needed: +1    Subjective Data  Subjective: I feel rough.  Dr. took of the ace wrap and had nurse place ice on my knee.   Patient Stated Goal: Walk without pain.    Cognition  Overall Cognitive Status: Appears within functional limits  for tasks assessed/performed Arousal/Alertness: Awake/alert Orientation Level: Appears intact for tasks assessed Behavior During Session: [facility] for tasks performed    Balance     End of Session PT - End of Session Equipment  Utilized During Treatment: Gait belt;Right knee immobilizer Activity Tolerance: Patient tolerated treatment well Patient left: in chair;with call bell/phone within reach;with family/visitor present Nurse Communication: Mobility status   GP     Ilir Mahrt 01/12/2012, 12:12 PM Shields Pautz L. Detric Scalisi DPT [phone]

## 2012-01-12 NOTE — Progress Notes (Signed)
Physical Therapy Treatment Patient Details Name: Joe Soto MRN: [id number] DOB: [birth year] Today's Date: 01/12/2012 Time: 1340-1430 PT Time Calculation (min): 50 min  PT Assessment / Plan / Recommendation Comments on Treatment Session  Pt continues to make steady progress and should be ready (fucntionally) for d/c to home tomorrow.       Follow Up Recommendations  Home health PT     Does the patient have the potential to tolerate intense rehabilitation     Barriers to Discharge        Equipment Recommendations  Rolling walker with 5" wheels;Other (comment)    Recommendations for Other Services    Frequency 7X/week   Plan Discharge plan remains appropriate;Frequency remains appropriate    Precautions / Restrictions Precautions Precautions: Knee Required Braces or Orthoses: Knee Immobilizer - Right Knee Immobilizer - Right: On except when in CPM Restrictions Weight Bearing Restrictions: Yes RLE Weight Bearing: Weight bearing as tolerated   Pertinent Vitals/Pain 6/10 pain in knee applied ice to knee at end of session.     Mobility  Bed Mobility Bed Mobility: Supine to Sit;Sit to Supine Supine to Sit: 5: Supervision;HOB flat Sitting - Scoot to Edge of Bed: 5: Supervision Sit to Supine: 5: Supervision Details for Bed Mobility Assistance: Instructed pt to use leg lifter for return to bed.  Transfers Transfers: Sit to Stand;Stand to Sit Sit to Stand: 5: Supervision;With upper extremity assist;From elevated surface;From bed Stand to Sit: 5: Supervision;To chair/3-in-1;With upper extremity assist Details for Transfer Assistance: Verbal cues for hand placement and Positioning of R LE. Ambulation/Gait Ambulation/Gait Assistance: 5: Supervision Ambulation Distance (Feet): 100 Feet Assistive device: Rolling walker Ambulation/Gait Assistance Details: Cues to decrease dependence on UEs,  Gait Pattern: Step-to pattern;Decreased stride length General Gait Details: Not able  to tolerate full wt on R LE secondary to pain.  Stairs: Yes Stairs Assistance: 4: Min guard;4: Min assist Stairs Assistance Details (indicate cue type and reason): Instucted pt in sequencing for anterior and postierior technique.   Stair Management Technique: One rail Left;With cane;Forwards;With walker;Backwards Number of Stairs: 2  (2 trials)    Exercises Total Joint Exercises Ankle Circles/Pumps: 10 reps;Both Quad Sets: 10 reps;Right Short Arc Quad: 5 reps;AROM Heel Slides: AAROM;5 reps;Right Straight Leg Raises: 5 reps;AAROM;Right Goniometric ROM: 5 degrees short of full extension to 68 degrees of AAROM;   PT Diagnosis:    PT Problem List:   PT Treatment Interventions:     PT Goals Acute Rehab PT Goals PT Goal Formulation: With patient Time For Goal Achievement: 01/18/12 Potential to Achieve Goals: Good Pt will go Supine/Side to Sit: Independently PT Goal: Supine/Side to Sit - Progress: Progressing toward goal Pt will go Sit to Supine/Side: Independently PT Goal: Sit to Supine/Side - Progress: Progressing toward goal Pt will Transfer Bed to Chair/Chair to Bed: with modified independence PT Transfer Goal: Bed to Chair/Chair to Bed - Progress: Progressing toward goal Pt will Ambulate: >150 feet;with modified independence;with rolling walker PT Goal: Ambulate - Progress: Progressing toward goal Pt will Go Up / Down Stairs: 6-9 stairs;with supervision;with least restrictive assistive device PT Goal: Up/Down Stairs - Progress: Progressing toward goal Pt will Perform Home Exercise Program: Independently PT Goal: Perform Home Exercise Program - Progress: Progressing toward goal  Visit Information  Last PT Received On: 01/12/12 Assistance Needed: +1    Subjective Data  Subjective: I am not hurting as bad as I was.  Patient Stated Goal: Walk without pain.    Cognition  Overall Cognitive Status:  Appears within functional limits for tasks assessed/performed Arousal/Alertness:  Awake/alert Orientation Level: Appears intact for tasks assessed Behavior During Session: [facility] for tasks performed    Balance     End of Session PT - End of Session Equipment Utilized During Treatment: Gait belt;Right knee immobilizer Activity Tolerance: Patient tolerated treatment well Patient left: in bed;with call bell/phone within reach;with family/visitor present Nurse Communication: Mobility status   GP     Aretta Stetzel 01/12/2012, 3:46 PM Shakendra Griffeth L. Pattricia Weiher DPT [phone]

## 2012-01-12 NOTE — Progress Notes (Signed)
Occupational Therapy Evaluation  01/12/12 1440  OT Visit Information  Last OT Received On 01/12/12  Assistance Needed +1  OT Time Calculation  OT Start Time 1440  OT Stop Time 1456  OT Time Calculation (min) 16 min  Precautions  Precautions Knee  Required Braces or Orthoses Knee Immobilizer - Right  Knee Immobilizer - Right On except when in CPM  Restrictions  Weight Bearing Restrictions Yes  RLE Weight Bearing WBAT  Home Living  Lives With Spouse  Available Help at Discharge Family;Available 24 hours/day  Type of Home House  Home Access Stairs to enter  Entrance Stairs-Number of Steps 8  Entrance Stairs-Rails Left  Home Layout Two level;Able to live on main level with bedroom/bathroom  Public affairs consultant Crutches  Prior Function  Level of Independence Independent  Able to Take Stairs? Yes  Driving Yes  Vocation Retired  International aid/development worker;Moderate assistance  Where Assessed - Lower Body Dressing Unsupported sit to stand  Toilet Transfer Simulated;Min guard  Toilet Transfer Method Sit to stand  Toilet Transfer Equipment Other (comment) (bed)  Equipment Used Knee Immobilizer;Gait belt;Rolling walker  Transfers/Ambulation Related to ADLs supervision with RW  ADL Comments Pt limited by pain and fatigue (had PT just prior to OT arrival)  Cognition  Overall Cognitive Status Appears within functional limits for tasks assessed/performed  Arousal/Alertness Awake/alert  Orientation Level Appears intact for tasks assessed  Behavior During Session [facility] for tasks performed  Right Upper Extremity Assessment  RUE ROM/Strength/Tone WFL  Left Upper Extremity Assessment  LUE ROM/Strength/Tone WFL  Bed Mobility  Bed Mobility Supine to Sit;Sitting - Scoot to Edge of Bed;Sit to Supine  Supine to Sit 5: Supervision;With rails  Sitting - Scoot to Edge of Bed 5:  Supervision  Sit to Supine 5: Supervision;With rail  Details for Bed Mobility Assistance increased time due to pain.  Transfers  Transfers Sit to Stand;Stand to Sit  Sit to Stand 4: Min guard;From bed  Stand to Sit 5: Supervision;To bed  Details for Transfer Assistance increased time due to pain. VC for safe hand placement.  OT - End of Session  Equipment Utilized During Treatment Gait belt  Activity Tolerance Patient limited by pain;Patient limited by fatigue  Patient left in bed;with call bell/phone within reach;with family/visitor present  Nurse Communication Patient requests pain meds  OT Assessment  Clinical Impression Statement Pt s/p R TKA thus affecting PLOF. Will benefit from acute OT services to address below problem list in prep for return home.  OT Recommendation/Assessment Patient needs continued OT Services  OT Problem List Decreased activity tolerance;Pain;Decreased knowledge of use of DME or AE  OT Therapy Diagnosis  Acute pain  OT Plan  OT Frequency Min 2X/week  OT Treatment/Interventions Self-care/ADL training;DME and/or AE instruction;Therapeutic activities;Patient/family education  OT Recommendation  Follow Up Recommendations No OT follow up;Supervision/Assistance - 24 hour  OT Equipment 3 in 1 bedside comode  Individuals Consulted  Consulted and Agree with Results and Recommendations Patient  Acute Rehab OT Goals  OT Goal Formulation With patient  Time For Goal Achievement 01/19/12  Potential to Achieve Goals Good  ADL Goals  Pt Will Transfer to Toilet with supervision;Ambulation;with DME;Comfort height toilet  Pt Will Perform Mudlogger transfer;with supervision;Ambulation;with DME;Shower seat with back  Pt Will Perform Lower Body Dressing with min assist;Sit to stand from chair;Sit to stand from bed (with wife independent in assisting)  ADL Goal: Lower Body Dressing - Progress Goal set today  ADL Goal: Toilet Transfer - Progress Goal set  today  ADL Goal: Tub/Shower Transfer - Progress Goal set today  OT General Charges  $OT Visit 1 Procedure  OT Evaluation  $Initial OT  Evaluation Tier I 1 Procedure  OT Treatments  $Self Care/Home Management  8-22 mins  Written Expression  Dominant Hand Right  01/12/2012 Joe Soto OTR/L Pager [phone] Office [phone]

## 2012-01-12 NOTE — Progress Notes (Signed)
Subjective: 1 Day Post-Op Procedure(s) (LRB): COMPUTER ASSISTED TOTAL KNEE ARTHROPLASTY (Right) Patient reports pain as moderate.  Some nausea as well.  Asymptomatic acute blood loss anemia.  Objective: Vital signs in last 24 hours: Temp:  [97.1 F (36.2 C)-99.1 F (37.3 C)] 99.1 F (37.3 C) (12/28 0706) Pulse Rate:  [61-80] 76  (12/28 0706) Resp:  [8-18] 16  (12/28 0706) BP: (130-158)/(42-70) 149/51 mmHg (12/28 0706) SpO2:  [96 %-100 %] 98 % (12/28 0706)  Intake/Output from previous day: 12/27 0701 - 12/28 0700 In: 3002.5 [P.O.:120; I.V.:2882.5] Out: 1350 [Urine:1300; Blood:50] Intake/Output this shift: Total I/O In: -  Out: 600 [Urine:600]   Basename 01/12/12 0636  HGB 9.8*    Basename 01/12/12 0636  WBC 6.2  RBC 2.68*  HCT 26.9*  PLT 181    Basename 01/12/12 0636  NA 135  K 4.5  CL 102  CO2 27  BUN 13  CREATININE 0.95  GLUCOSE 152*  CALCIUM 8.1*   No results found for this basename: LABPT:2,INR:2 in the last 72 hours  Sensation intact distally Dorsiflexion/Plantar flexion intact Incision: dressing C/D/I Compartment soft  Assessment/Plan: 1 Day Post-Op Procedure(s) (LRB): COMPUTER ASSISTED TOTAL KNEE ARTHROPLASTY (Right) Up with therapy  Joe Soto 01/12/2012, 7:35 AM

## 2012-01-12 NOTE — Progress Notes (Signed)
Physical Therapy Treatment Patient Details Name: KEYONTAE HUCKEBY MRN: [id number] DOB: [birth year] Today's Date: 01/12/2012 Time: 0730-0800 PT Time Calculation (min): 30 min  PT Assessment / Plan / Recommendation Comments on Treatment Session  Pt mobility limited by c/o nausea and fatigue.  ROM limited by pain.  Performed manual joint mobilization for pain relief and increase in ROM.  Applied CPM . Pt unable to tolerate at 60 degrees.  Decreased to 42 degrees and instructed daughter to increase by 5 every half hour.  Instructed daughter to operate CPM machine.      Follow Up Recommendations  Home health PT     Does the patient have the potential to tolerate intense rehabilitation     Barriers to Discharge        Equipment Recommendations  Rolling walker with 5" wheels;Other (comment)    Recommendations for Other Services    Frequency 7X/week   Plan Discharge plan remains appropriate;Frequency remains appropriate    Precautions / Restrictions Precautions Precautions: Knee Required Braces or Orthoses: Knee Immobilizer - Right Knee Immobilizer - Right: On except when in CPM Restrictions Weight Bearing Restrictions: Yes RLE Weight Bearing: Weight bearing as tolerated   Pertinent Vitals/Pain 7-8/10  Pain in knee with activity.  Pt premedicated.     Mobility  Bed Mobility Bed Mobility: Not assessed Transfers Transfers: Not assessed Ambulation/Gait Ambulation/Gait Assistance: Not tested (comment) Wheelchair Mobility Wheelchair Mobility: No    Exercises Total Joint Exercises Ankle Circles/Pumps: 10 reps;Both Quad Sets: 10 reps;Right Heel Slides: 10 reps;AAROM;Right Straight Leg Raises: 5 reps;AAROM;Right   PT Diagnosis:    PT Problem List:   PT Treatment Interventions:     PT Goals Acute Rehab PT Goals PT Goal Formulation: With patient Time For Goal Achievement: 01/18/12 Potential to Achieve Goals: Good Pt will Perform Home Exercise Program: Independently PT Goal:  Perform Home Exercise Program - Progress: Progressing toward goal  Visit Information  Last PT Received On: 01/12/12    Subjective Data  Subjective: Still nauseated and more painful today.  Patient Stated Goal: Walk without pain.    Cognition  Overall Cognitive Status: Appears within functional limits for tasks assessed/performed Arousal/Alertness: Awake/alert Orientation Level: Appears intact for tasks assessed Behavior During Session: [facility] for tasks performed    Balance     End of Session PT - End of Session Activity Tolerance: Patient limited by pain Patient left: in bed;in CPM;with call bell/phone within reach;with family/visitor present CPM Right Knee CPM Right Knee: On Right Knee Flexion (Degrees): 42  Right Knee Extension (Degrees): 0  Additional Comments: CPM on at 8 AM   GP     Shoshana Johal 01/12/2012, 9:27 AM Haylen Bellotti L. [place] DPT [phone]

## 2012-01-12 NOTE — Progress Notes (Signed)
Orthopedic Tech Progress Note Patient Details:  Joe Soto 10-28-1923 [id number] Bedside nurse on 6N called stating patient's family had questions about CPM machine. Spoke with patient's family and educated them on purpose and use of CPM. Patient stated he had already been in CPM for the day. CPM to be reapplied tomorrow as tolerated by patient.  Patient ID: Joe Soto, male   DOB: July 13, 1923, 78 y.o.   MRN: [id number]   Joe Soto 01/12/2012, 5:53 PM

## 2012-01-13 LAB — CBC
HCT: 24.7 % — ABNORMAL LOW (ref 39.0–52.0)
Hemoglobin: 8.7 g/dL — ABNORMAL LOW (ref 13.0–17.0)
MCH: 36.3 pg — ABNORMAL HIGH (ref 26.0–34.0)
MCHC: 35.2 g/dL (ref 30.0–36.0)
MCV: 102.9 fL — ABNORMAL HIGH (ref 78.0–100.0)

## 2012-01-13 MED ORDER — OXYCODONE-ACETAMINOPHEN 5-325 MG PO TABS: 1.0000 | ORAL_TABLET | ORAL | Status: DC | PRN

## 2012-01-13 MED ORDER — FERROUS SULFATE 325 (65 FE) MG PO TABS: 325.0000 mg | ORAL_TABLET | Freq: Three times a day (TID) | ORAL | Status: DC

## 2012-01-13 MED ORDER — ASPIRIN 325 MG PO TBEC: 325.0000 mg | DELAYED_RELEASE_TABLET | Freq: Every day | ORAL | Status: DC

## 2012-01-13 NOTE — Progress Notes (Signed)
Subjective: 2 Days Post-Op Procedure(s) (LRB): COMPUTER ASSISTED TOTAL KNEE ARTHROPLASTY (Right) Patient reports pain as mild.  Working very well with PT.  Asymptomatic acute blood loss anemia.  Objective: Vital signs in last 24 hours: Temp:  [97.7 F (36.5 C)-99.9 F (37.7 C)] 98.6 F (37 C) (12/29 0606) Pulse Rate:  [84-98] 98  (12/29 0606) Resp:  [18-19] 18  (12/29 0606) BP: (113-128)/(43-71) 116/71 mmHg (12/29 0606) SpO2:  [92 %-100 %] 96 % (12/29 0837) Weight:  [71.623 kg (157 lb 14.4 oz)] 71.623 kg (157 lb 14.4 oz) (12/28 1552)  Intake/Output from previous day: 12/28 0701 - 12/29 0700 In: 990 [P.O.:990] Out: 1225 [Urine:1225] Intake/Output this shift:     Basename 01/13/12 0540 01/12/12 0636  HGB 8.7* 9.8*    Basename 01/13/12 0540 01/12/12 0636  WBC 6.9 6.2  RBC 2.40* 2.68*  HCT 24.7* 26.9*  PLT 165 181    Basename 01/12/12 0636  NA 135  K 4.5  CL 102  CO2 27  BUN 13  CREATININE 0.95  GLUCOSE 152*  CALCIUM 8.1*   No results found for this basename: LABPT:2,INR:2 in the last 72 hours  Sensation intact distally Intact pulses distally Dorsiflexion/Plantar flexion intact Incision: dressing C/D/I No cellulitis present Compartment soft  Assessment/Plan: 2 Days Post-Op Procedure(s) (LRB): COMPUTER ASSISTED TOTAL KNEE ARTHROPLASTY (Right) Discharge home with home health  Kathryne Hitch 01/13/2012, 9:05 AM

## 2012-01-13 NOTE — Discharge Summary (Signed)
Patient ID: Joe Soto MRN: [id number] DOB/AGE: November 28, 1923 78 y.o.  Admit date: 01/11/2012 Discharge date: 01/13/2012  Admission Diagnoses:  Principal Problem:  *Osteoarthritis of right knee   Discharge Diagnoses:  Same  Past Medical History  Diagnosis Date  . Hypertension   . Hyperlipidemia   . Chronic asthma   . Hypothyroidism   . Stones in the urinary tract   . Stroke 4    tia  . GERD (gastroesophageal reflux disease)   . Cancer 08    prostate  . Arthritis     Surgeries: Procedure(s): COMPUTER ASSISTED TOTAL KNEE ARTHROPLASTY on 01/11/2012   Consultants:    Discharged Condition: Improved  Hospital Course: Joe Soto is an 78 y.o. male who was admitted 01/11/2012 for operative treatment ofOsteoarthritis of right knee. Patient has severe unremitting pain that affects sleep, daily activities, and work/hobbies. After pre-op clearance the patient was taken to the operating room on 01/11/2012 and underwent  Procedure(s): COMPUTER ASSISTED TOTAL KNEE ARTHROPLASTY.    Patient was given perioperative antibiotics: Anti-infectives     Start     Dose/Rate Route Frequency Ordered Stop   01/11/12 1345   ceFAZolin (ANCEF) IVPB 1 g/50 mL premix        1 g 100 mL/hr over 30 Minutes Intravenous Every 6 hours 01/11/12 1240 01/11/12 2032   01/10/12 1358   ceFAZolin (ANCEF) IVPB 2 g/50 mL premix        2 g 100 mL/hr over 30 Minutes Intravenous 60 min pre-op 01/10/12 1358 01/11/12 0745           Patient was given sequential compression devices, early ambulation, and chemoprophylaxis to prevent DVT.  Patient benefited maximally from hospital stay and there were no complications.    Recent vital signs: Patient Vitals for the past 24 hrs:  BP Temp Temp src Pulse Resp SpO2 Height Weight  01/13/12 0837 - - - - - 96 % - -  01/13/12 0606 116/71 mmHg 98.6 F (37 C) Oral 98  18  98 % - -  01/13/12 0400 - - - - 18  94 % - -  01/13/12 0148 113/43 mmHg 97.7 F (36.5 C)  Oral 89  18  92 % - -  01/13/12 0000 - - - - 18  - - -  01/25/12 2215 125/44 mmHg 99.9 F (37.7 C) Oral 86  19  94 % - -  Jan 25, 2012 2000 - - - - 19  96 % - -  25-Jan-2012 1552 128/53 mmHg 98.7 F (37.1 C) Oral 84  18  93 % 5\' 11"  (1.803 m) 71.623 kg (157 lb 14.4 oz)  January 25, 2012 1200 - - - - 18  100 % - -     Recent laboratory studies:  [facility] 01/13/12 0540 01-25-12 0636  WBC 6.9 6.2  HGB 8.7* 9.8*  HCT 24.7* 26.9*  PLT 165 181  NA -- 135  K -- 4.5  CL -- 102  CO2 -- 27  BUN -- 13  CREATININE -- 0.95  GLUCOSE -- 152*  INR -- --  CALCIUM -- 8.1*     Discharge Medications:     Medication List     As of 01/13/2012  9:10 AM    TAKE these medications         albuterol 108 (90 BASE) MCG/ACT inhaler   Commonly known as: PROVENTIL HFA;VENTOLIN HFA   Inhale 2 puffs into the lungs every 6 (six) hours as needed. For shortness of breath  amLODipine 5 MG tablet   Commonly known as: NORVASC   Take 5 mg by mouth daily.      aspirin 325 MG EC tablet   Take 1 tablet (325 mg total) by mouth daily.      calcium-vitamin D 500-200 MG-UNIT per tablet   Commonly known as: OSCAL WITH D   Take 2 tablets by mouth daily. 600 mg+d3 400iu      cholecalciferol 1000 UNITS tablet   Commonly known as: VITAMIN D   Take 2,000 Units by mouth daily.      ferrous sulfate 325 (65 FE) MG tablet   Take 1 tablet (325 mg total) by mouth 3 (three) times daily with meals.      fish oil-omega-3 fatty acids 1000 MG capsule   Take 1,200 g by mouth daily.      fluticasone-salmeterol 230-21 MCG/ACT inhaler   Commonly known as: ADVAIR HFA   Inhale 2 puffs into the lungs 2 (two) times daily.      levothyroxine 75 MCG tablet   Commonly known as: SYNTHROID, LEVOTHROID   Take 75 mcg by mouth daily.      multivitamin with minerals Tabs   Take 1 tablet by mouth daily.      omeprazole 40 MG capsule   Commonly known as: PRILOSEC   Take 40 mg by mouth 2 (two) times daily.      oxyCODONE-acetaminophen  5-325 MG per tablet   Commonly known as: PERCOCET/ROXICET   Take 1 tablet by mouth every 4 (four) hours as needed for pain.      zafirlukast 20 MG tablet   Commonly known as: ACCOLATE   Take 20 mg by mouth 2 (two) times daily.        Diagnostic Studies: Dg Chest 2 View  01/07/2012  *RADIOLOGY REPORT*  Clinical Data: Preop knee surgery  CHEST - 2 VIEW  Comparison: 10/23/2011 CT and earlier studies  Findings: Several 5 mm and smaller nodules identified on previous CT are inapparent on these radiographs.  Coarse perihilar bronchovascular markings stable.  No new infiltrate or overt edema. Cervical fixation hardware partially seen.  No effusion. Spondylitic changes in the lower thoracic spine.  IMPRESSION:  No acute disease   Original Report Authenticated By: D. Andria Rhein, MD     Disposition: to home       Discharge Orders    Future Appointments: Provider: Department: Dept Phone: Center:   04/09/2012 1:30 PM Barbaraann Share, MD  [facility] [phone] None     Future Orders Please Complete By Expires   Diet - low sodium heart healthy      Call MD / Call 911      Comments:   If you experience chest pain or shortness of breath, CALL 911 and be transported to the hospital emergency room.  If you develope a fever above 101 F, pus (white drainage) or increased drainage or redness at the wound, or calf pain, call your surgeon's office.   Constipation Prevention      Comments:   Drink plenty of fluids.  Prune juice may be helpful.  You may use a stool softener, such as Colace (over the counter) 100 mg twice a day.  Use MiraLax (over the counter) for constipation as needed.   Increase activity slowly as tolerated      Discharge instructions      Comments:   Increase your activities as comfort allows. Ice and elevation for swelling. Wait 5 days before getting  your incision wet in the shower.   Discharge patient         Follow-up Information    Follow up with Eldred Manges,  MD. Schedule an appointment as soon as possible for a visit in 2 weeks.   Contact information:   [address] [place] [address] [phone]           Signed: Kathryne Hitch 01/13/2012, 9:10 AM

## 2012-01-13 NOTE — Progress Notes (Signed)
Occupational Therapy Treatment and Discharge Patient Details Name: Joe Soto MRN: [id number] DOB: [birth year] Today's Date: 01/13/2012 Time: 9562-1308 OT Time Calculation (min): 18 min  OT Assessment / Plan / Recommendation Comments on Treatment Session Anticipate d/c home today.  Pt has met goals. No further acute OT needs.    Follow Up Recommendations  No OT follow up;Supervision/Assistance - 24 hour    Barriers to Discharge       Equipment Recommendations  3 in 1 bedside comode    Recommendations for Other Services    Frequency Min 2X/week   Plan Discharge plan remains appropriate;All goals met and education completed, patient discharged from OT services    Precautions / Restrictions Precautions Precautions: Knee Required Braces or Orthoses: Knee Immobilizer - Right Knee Immobilizer - Right: On except when in CPM Restrictions Weight Bearing Restrictions: Yes RLE Weight Bearing: Weight bearing as tolerated   Pertinent Vitals/Pain See vitals    ADL  Grooming: Performed;Wash/dry hands;Wash/dry face;Teeth care;Modified independent Where Assessed - Grooming: Unsupported standing Upper Body Dressing: Performed;Set up Where Assessed - Upper Body Dressing: Unsupported standing Lower Body Dressing: Performed;Minimal assistance Where Assessed - Lower Body Dressing: Unsupported sit to stand Toilet Transfer: Performed;Supervision/safety Toilet Transfer Method: Sit to Barista: Raised toilet seat with arms (or 3-in-1 over toilet) Tub/Shower Transfer: Landscape architect Method: Science writer: Walk in Scientist, research (physical sciences) Used: Rolling walker Transfers/Ambulation Related to ADLs: supervision with RW ADL Comments: Pt performed UB/LB dressing with wife providing min assist as needed.  Educated pt on stepping back into walk in shower while leading with L LE.      OT Diagnosis:    OT Problem List:   OT  Treatment Interventions:     OT Goals ADL Goals Pt Will Perform Lower Body Dressing: with min assist;Sit to stand from chair;Sit to stand from bed ADL Goal: Lower Body Dressing - Progress: Met Pt Will Transfer to Toilet: with supervision;Ambulation;with DME;Comfort height toilet ADL Goal: Toilet Transfer - Progress: Met Pt Will Perform Tub/Shower Transfer: Shower transfer;with supervision;Ambulation;with DME;Shower seat with back ADL Goal: Web designer - Progress: Met  Visit Information  Last OT Received On: 01/13/12 Assistance Needed: +1    Subjective Data      Prior Functioning       Cognition  Overall Cognitive Status: Appears within functional limits for tasks assessed/performed Arousal/Alertness: Awake/alert Orientation Level: Appears intact for tasks assessed Behavior During Session: [facility] for tasks performed    Mobility  Shoulder Instructions Bed Mobility Bed Mobility: Supine to Sit;Sitting - Scoot to Edge of Bed Supine to Sit: 6: Modified independent (Device/Increase time) Sitting - Scoot to Edge of Bed: 6: Modified independent (Device/Increase time)  Details for Bed Mobility Assistance: educated pt to use his left leg to lift his right leg onto bed when lying down. pt able to move right leg off bed without assistance. Transfers Transfers: Sit to Stand;Stand to Sit Sit to Stand: 6: Modified independent (Device/Increase time);From bed;With upper extremity assist;From chair/3-in-1 Stand to Sit: 6: Modified independent (Device/Increase time);To bed;With armrests;With upper extremity assist;To chair/3-in-1 Details for Transfer Assistance: independently demonstrated safe hand placement          Balance     End of Session OT - End of Session Equipment Utilized During Treatment:  (RW) Activity Tolerance: Patient tolerated treatment well Patient left: with call bell/phone within reach;with family/visitor present (sitting EOB) Nurse Communication: Mobility status   GO   01/13/2012 Joe Soto OTR/L Pager [phone]  Office [phone]   Joe Soto 01/13/2012, 11:29 AM

## 2012-01-13 NOTE — Progress Notes (Signed)
Physical Therapy Treatment Patient Details Name: Joe Soto MRN: [id number] DOB: 1923-02-08 Today's Date: 01/13/2012 Time: 4098-1191 PT Time Calculation (min): 31 min  PT Assessment / Plan / Recommendation Comments on Treatment Session  Pt making great progress toward his goals with less cues/assitance needed today with mobility.     Follow Up Recommendations  Home health PT           Equipment Recommendations  Rolling walker with 5" wheels;Other (comment) (3n1)       Frequency 7X/week   Plan Discharge plan remains appropriate;Frequency remains appropriate    Precautions / Restrictions Precautions Precautions: Knee Required Braces or Orthoses: Knee Immobilizer - Right Knee Immobilizer - Right: On except when in CPM (discontinued today with active SLR) Restrictions RLE Weight Bearing: Weight bearing as tolerated    Pertinent Vitals/Pain 5/10 right knee pain before and after session. RN premedicated pt before therapy.    Mobility  Bed Mobility Supine to Sit: 6: Modified independent (Device/Increase time);HOB flat Sitting - Scoot to Edge of Bed: 5: Supervision Sit to Supine: 6: Modified independent (Device/Increase time);HOB flat Details for Bed Mobility Assistance: educated pt to use his left leg to lift his right leg onto bed when lying down. pt able to move right leg off bed without assistance. Transfers Sit to Stand: 6: Modified independent (Device/Increase time);From bed Stand to Sit: 6: Modified independent (Device/Increase time);To bed;With upper extremity assist Details for Transfer Assistance: Repeated transfer x2 reps for education on safe hand placment and for walker adjustment (had pt sit back down while walker was adjusted to his height) Ambulation/Gait Ambulation/Gait Assistance: 5: Supervision Ambulation Distance (Feet): 100 Feet Assistive device: Rolling walker Ambulation/Gait Assistance Details: cues for incr right stance time and incr left step  length with gait. Pt with improved posture after walker adjusted up to correct height.  Gait Pattern: Step-to pattern;Decreased stance time - right;Decreased step length - left;Decreased step length - right;Antalgic Gait velocity: Decreased    Exercises Total Joint Exercises Ankle Circles/Pumps: AROM;Both;10 reps;Supine Quad Sets: AROM;Strengthening;Right;10 reps;Supine Heel Slides: AAROM;Strengthening;Right;5 reps;Supine (pt used sheet for self AAROM) Straight Leg Raises: AROM;Strengthening;Right;10 reps;Supine Long Arc Quad: AROM;Strengthening;Right;10 reps;Seated Knee Flexion: AROM;Strengthening;Right;5 reps;Seated Goniometric ROM: supine: -5 degrees from neutral. Seated: 70 degrees of active knee flexion.    PT Goals Acute Rehab PT Goals PT Goal: Supine/Side to Sit - Progress: Progressing toward goal PT Goal: Sit to Supine/Side - Progress: Progressing toward goal PT Transfer Goal: Bed to Chair/Chair to Bed - Progress: Progressing toward goal PT Goal: Ambulate - Progress: Progressing toward goal PT Goal: Perform Home Exercise Program - Progress: Progressing toward goal  Visit Information  Last PT Received On: 01/13/12 Assistance Needed: +1    Subjective Data  Subjective: Reports feeling better today.    Cognition  Overall Cognitive Status: Appears within functional limits for tasks assessed/performed Arousal/Alertness: Awake/alert Orientation Level: Appears intact for tasks assessed Behavior During Session: [facility] for tasks performed       End of Session PT - End of Session Equipment Utilized During Treatment: Gait belt Activity Tolerance: Patient tolerated treatment well Patient left: in bed;with call bell/phone within reach;with family/visitor present (pt seated on edge of bed) Nurse Communication: Mobility status   GP     Sallyanne Kuster 01/13/2012, 9:42 AM  Sallyanne Kuster, PTA Office- [phone]

## 2012-01-14 ENCOUNTER — Encounter (HOSPITAL_COMMUNITY): Payer: Self-pay | Admitting: Orthopaedic Surgery

## 2012-01-14 NOTE — Progress Notes (Signed)
NCM contacted pt at home. Wife states pt was resting. States they did pick up RW and 3n1 and paid for out of pocket. Explained to wife that call was to arrange [facility] PT. Offered choice for [facility], wife states she did not have an agency that she preferred to use. Explained NCM will follow up with agency that could do soc today or 12/31 and give her a call back with name. NCM contacted [facility], spoke to rep, Jodelle Red. States they could have HH PT go out today for [facility] PT. Faxed orders, facesheet and dc summary to [employer]. Contacted wife and provided contact info for agency and soc date. Isidoro Donning RN CCM Case Mgmt phone [phone]

## 2012-02-13 ENCOUNTER — Other Ambulatory Visit: Payer: Self-pay | Admitting: Pulmonary Disease

## 2012-02-13 MED ORDER — FLUTICASONE-SALMETEROL 230-21 MCG/ACT IN AERO: 2.0000 | INHALATION_SPRAY | Freq: Two times a day (BID) | RESPIRATORY_TRACT | Status: DC

## 2012-04-09 ENCOUNTER — Ambulatory Visit: Payer: Medicare Other | Admitting: Pulmonary Disease

## 2012-04-25 ENCOUNTER — Encounter: Payer: Self-pay | Admitting: Pulmonary Disease

## 2012-04-25 ENCOUNTER — Ambulatory Visit (INDEPENDENT_AMBULATORY_CARE_PROVIDER_SITE_OTHER): Payer: Medicare Other | Admitting: Pulmonary Disease

## 2012-04-25 VITALS — BP 132/52 | HR 68 | Temp 97.2°F | Ht 71.0 in | Wt 147.8 lb

## 2012-04-25 DIAGNOSIS — J449 Chronic obstructive pulmonary disease, unspecified: Secondary | ICD-10-CM

## 2012-04-25 DIAGNOSIS — J4489 Other specified chronic obstructive pulmonary disease: Secondary | ICD-10-CM

## 2012-04-25 NOTE — Assessment & Plan Note (Signed)
The patient is doing very well on his current bronchodilator regimen, and has not had an acute exacerbation or require rescue inhaler use.  I have asked him to continue on his same medications, and to followup with me in 6 months.

## 2012-04-25 NOTE — Patient Instructions (Addendum)
Stay on current medication Stay active as possible followup with me in 6mos.

## 2012-04-25 NOTE — Progress Notes (Signed)
  Subjective:    Patient ID: Joe Soto, male    DOB: 1923/04/09, 79 y.o.   MRN: [id number]  HPI Patient comes in today for followup of his known chronic obstructive asthma.  He has done very well since last visit, and rarely uses his rescue inhaler.  He has gone through knee replacement surgery without pulmonary difficulties, and is staying active currently.   Review of Systems  Constitutional: Negative for fever and unexpected weight change.  HENT: Negative for ear pain, nosebleeds, congestion, sore throat, rhinorrhea, sneezing, trouble swallowing, dental problem, postnasal drip and sinus pressure.   Eyes: Negative for redness and itching.  Respiratory: Negative for cough, chest tightness, shortness of breath and wheezing.   Cardiovascular: Negative for palpitations and leg swelling.  Gastrointestinal: Negative for nausea and vomiting.  Genitourinary: Negative for dysuria.  Musculoskeletal: Negative for joint swelling.  Skin: Negative for rash.  Neurological: Negative for headaches.  Hematological: Does not bruise/bleed easily.  Psychiatric/Behavioral: Negative for dysphoric mood. The patient is not nervous/anxious.        Objective:   Physical Exam Thin male in no acute distress Nose without purulence or discharge noted Neck without lymphadenopathy or thyromegaly Chest totally clear to auscultation, no wheezing Cardiac exam with regular rate and rhythm Lower extremities with minimal ankle edema on the right, otherwise unremarkable Alert and oriented, moves all 4 extremities.       Assessment & Plan:

## 2012-07-08 ENCOUNTER — Other Ambulatory Visit: Payer: Self-pay | Admitting: Dermatology

## 2012-07-21 ENCOUNTER — Telehealth: Payer: Self-pay | Admitting: Pulmonary Disease

## 2012-07-21 NOTE — Telephone Encounter (Signed)
Received 12 pages from [facility], sent to Dr. Shelle Iron on 07/21/12/ss

## 2012-09-10 ENCOUNTER — Other Ambulatory Visit: Payer: Self-pay | Admitting: Urology

## 2012-09-10 DIAGNOSIS — C61 Malignant neoplasm of prostate: Secondary | ICD-10-CM

## 2012-09-18 ENCOUNTER — Encounter (HOSPITAL_COMMUNITY)
Admission: RE | Admit: 2012-09-18 | Discharge: 2012-09-18 | Disposition: A | Discharge status: Home / Self Care | Payer: Medicare Other | Source: Ambulatory Visit | Attending: Urology | Admitting: Urology

## 2012-09-18 ENCOUNTER — Encounter (HOSPITAL_COMMUNITY): Payer: Self-pay

## 2012-09-18 DIAGNOSIS — C61 Malignant neoplasm of prostate: Secondary | ICD-10-CM | POA: Insufficient documentation

## 2012-09-18 MED ORDER — TECHNETIUM TC 99M MEDRONATE IV KIT
25.6000 | PACK | Freq: Once | INTRAVENOUS | Status: AC | PRN
  Administered 2012-09-18: 25.6 via INTRAVENOUS

## 2012-10-17 ENCOUNTER — Telehealth: Payer: Self-pay | Admitting: Pulmonary Disease

## 2012-10-17 MED ORDER — CEFDINIR 300 MG PO CAPS: 600.0000 mg | ORAL_CAPSULE | ORAL | Status: DC

## 2012-10-17 NOTE — Telephone Encounter (Signed)
Ok to call in omnicef 300mg, take 2 each am for 5 days.  

## 2012-10-17 NOTE — Telephone Encounter (Signed)
Rx has been sent in per [facility]. Pt is aware.

## 2012-10-17 NOTE — Telephone Encounter (Signed)
Spoke to pt. Reports chest tightness, wheezing, SOB and cough with production of yellow/white mucus. Denies fever/body aches. Has tried several OTC medications without relief. Requests an ABX be sent to his pharmacy.  KC - please advise. Thanks.

## 2012-10-29 ENCOUNTER — Encounter: Payer: Self-pay | Admitting: Pulmonary Disease

## 2012-10-29 ENCOUNTER — Ambulatory Visit (INDEPENDENT_AMBULATORY_CARE_PROVIDER_SITE_OTHER): Payer: Medicare Other | Admitting: Pulmonary Disease

## 2012-10-29 VITALS — BP 124/80 | HR 56 | Temp 97.6°F | Ht 71.0 in | Wt 149.0 lb

## 2012-10-29 DIAGNOSIS — Z23 Encounter for immunization: Secondary | ICD-10-CM

## 2012-10-29 DIAGNOSIS — J449 Chronic obstructive pulmonary disease, unspecified: Secondary | ICD-10-CM

## 2012-10-29 NOTE — Assessment & Plan Note (Signed)
The patient is doing very well from an asthma standpoint, and feels that his breathing is at his usual baseline.  I have asked him to continue on his current regimen, and also to stay as active as possible.  He is having ongoing allergy symptoms, and I have asked him to try Zyrtec.  If this does not control things well enough to his satisfaction, we can try astepro.  He has done well on this prn in the past.

## 2012-10-29 NOTE — Patient Instructions (Signed)
Stay on your current asthma medications Try zyrtec (ceterizine) 10mg  one each night at bedtime.  Give it about 2-3 weeks to see if helps.  If it does not control your nasal symptoms to your satisfaction, please call and we can call in a prescription for astepro nasal spray. Will give you the flu shot today followup with me in 6mos.

## 2012-10-29 NOTE — Progress Notes (Signed)
  Subjective:    Patient ID: Joe Soto, male    DOB: 1923/02/16, 79 y.o.   MRN: [id number]  HPI The patient comes in today for followup of his known asthma.  He has done very well his current regimen, although did have an episode of acute bronchitis recently which responded easily to antibiotics.  He did not require a round of prednisone.  He rarely uses his albuterol inhaler, and feels that his breathing is doing well.  He is still having rhinorrhea with postnasal drip, but is not staying on an antihistamine on a regular basis.  He has been on astepro in the past with great relief.   Review of Systems  Constitutional: Negative for fever and unexpected weight change.  HENT: Positive for rhinorrhea. Negative for congestion, dental problem, ear pain, nosebleeds, postnasal drip, sinus pressure, sneezing, sore throat and trouble swallowing.   Eyes: Negative for redness and itching.  Respiratory: Positive for cough. Negative for chest tightness, shortness of breath and wheezing.   Cardiovascular: Negative for palpitations and leg swelling.  Gastrointestinal: Negative for nausea and vomiting.  Genitourinary: Negative for dysuria.  Musculoskeletal: Negative for joint swelling.  Skin: Negative for rash.  Neurological: Negative for headaches.  Hematological: Does not bruise/bleed easily.  Psychiatric/Behavioral: Negative for dysphoric mood. The patient is not nervous/anxious.        Objective:   Physical Exam Thin male in no acute distress Nose without purulence or discharge noted Neck without lymphadenopathy or thyromegaly Chest totally clear to auscultation, no wheezing Cardiac exam was regular rate and rhythm Lower extremities with minimal ankle edema, no cyanosis Alert and oriented, moves all 4 extremities.       Assessment & Plan:

## 2013-02-19 ENCOUNTER — Other Ambulatory Visit: Payer: Self-pay | Admitting: *Deleted

## 2013-02-19 MED ORDER — ZAFIRLUKAST 20 MG PO TABS: 20.0000 mg | ORAL_TABLET | Freq: Two times a day (BID) | ORAL | Status: DC

## 2013-02-25 ENCOUNTER — Other Ambulatory Visit: Payer: Self-pay

## 2013-02-25 MED ORDER — ZAFIRLUKAST 20 MG PO TABS: 20.0000 mg | ORAL_TABLET | Freq: Two times a day (BID) | ORAL | Status: DC

## 2013-03-30 ENCOUNTER — Other Ambulatory Visit: Payer: Self-pay | Admitting: Pulmonary Disease

## 2013-04-29 ENCOUNTER — Ambulatory Visit (INDEPENDENT_AMBULATORY_CARE_PROVIDER_SITE_OTHER): Payer: PRIVATE HEALTH INSURANCE | Admitting: Pulmonary Disease

## 2013-04-29 ENCOUNTER — Encounter: Payer: Self-pay | Admitting: Pulmonary Disease

## 2013-04-29 VITALS — BP 138/70 | HR 65 | Temp 96.8°F | Ht 71.0 in | Wt 156.8 lb

## 2013-04-29 DIAGNOSIS — J4489 Other specified chronic obstructive pulmonary disease: Secondary | ICD-10-CM

## 2013-04-29 DIAGNOSIS — J449 Chronic obstructive pulmonary disease, unspecified: Secondary | ICD-10-CM

## 2013-04-29 NOTE — Patient Instructions (Signed)
No change in medications Try taking zyrtec (cetirizine) 10mg  one each day for the next 3 weeks to see if helps allergy symptoms. followup with me in 45mos.

## 2013-04-29 NOTE — Progress Notes (Signed)
   Subjective:    Patient ID: Joe Soto, male    DOB: 16-Feb-1923, 80 y.o.   MRN: [id number]  HPI Patient comes in today for followup of his known asthma. He is doing well on his current medications, and has not had an acute exacerbation or increase in her rescue inhaler use. He feels that his exertional tolerance is at baseline, but is having more allergy symptoms. He is currently not taking an antihistamine.   Review of Systems  Constitutional: Negative for fever and unexpected weight change.  HENT: Negative for congestion, dental problem, ear pain, nosebleeds, postnasal drip, rhinorrhea, sinus pressure, sneezing, sore throat and trouble swallowing.   Eyes: Negative for redness and itching.  Respiratory: Negative for cough, chest tightness, shortness of breath and wheezing.   Cardiovascular: Negative for palpitations and leg swelling.  Gastrointestinal: Negative for nausea and vomiting.  Genitourinary: Negative for dysuria.  Musculoskeletal: Negative for joint swelling.  Skin: Negative for rash.  Neurological: Negative for headaches.  Hematological: Does not bruise/bleed easily.  Psychiatric/Behavioral: Negative for dysphoric mood. The patient is not nervous/anxious.        Objective:   Physical Exam Thin male in no acute distress Nose without purulence or discharge noted Neck without lymphadenopathy or thyromegaly Chest totally clear to auscultation, no wheezing Cardiac exam with regular rate and rhythm Lower extremities with mild ankle edema, no cyanosis Alert and oriented, moves all 4 extremities       Assessment & Plan:

## 2013-04-29 NOTE — Assessment & Plan Note (Signed)
The patient is doing well from an asthma standpoint on his current regimen. His exertional tolerance is stable, and he has not had an acute exacerbation. He is having increased allergy symptoms, and I've asked him to take a daily antihistamine until the allergy season is over.

## 2013-05-02 ENCOUNTER — Other Ambulatory Visit: Payer: Self-pay | Admitting: Pulmonary Disease

## 2013-05-07 ENCOUNTER — Telehealth: Payer: Self-pay | Admitting: Pulmonary Disease

## 2013-05-07 MED ORDER — FLUTICASONE-SALMETEROL 230-21 MCG/ACT IN AERO: 2.0000 | INHALATION_SPRAY | Freq: Two times a day (BID) | RESPIRATORY_TRACT | Status: DC

## 2013-05-07 NOTE — Telephone Encounter (Signed)
Rx for advair HFA was refilled

## 2013-06-12 ENCOUNTER — Telehealth: Payer: Self-pay | Admitting: Pulmonary Disease

## 2013-06-12 ENCOUNTER — Other Ambulatory Visit: Payer: Self-pay | Admitting: Pulmonary Disease

## 2013-06-12 NOTE — Telephone Encounter (Signed)
I spoke with [facility]-they confirmed they got refill for Preventil HFA. Nothing more needed at this time.

## 2013-06-15 ENCOUNTER — Encounter (HOSPITAL_COMMUNITY)
Admission: RE | Disposition: A | Discharge status: Home / Self Care | Payer: Medicare Other | Source: Ambulatory Visit | Attending: Cardiology

## 2013-06-15 ENCOUNTER — Ambulatory Visit (HOSPITAL_COMMUNITY)
Admission: RE | Admit: 2013-06-15 | Discharge: 2013-06-16 | Disposition: A | Discharge status: Home / Self Care | Payer: PRIVATE HEALTH INSURANCE | Source: Ambulatory Visit | Attending: Cardiology | Admitting: Cardiology

## 2013-06-15 DIAGNOSIS — I4949 Other premature depolarization: Secondary | ICD-10-CM | POA: Insufficient documentation

## 2013-06-15 DIAGNOSIS — I700 Atherosclerosis of aorta: Secondary | ICD-10-CM | POA: Insufficient documentation

## 2013-06-15 DIAGNOSIS — Z955 Presence of coronary angioplasty implant and graft: Secondary | ICD-10-CM

## 2013-06-15 DIAGNOSIS — I10 Essential (primary) hypertension: Secondary | ICD-10-CM | POA: Insufficient documentation

## 2013-06-15 DIAGNOSIS — N179 Acute kidney failure, unspecified: Secondary | ICD-10-CM | POA: Insufficient documentation

## 2013-06-15 DIAGNOSIS — E785 Hyperlipidemia, unspecified: Secondary | ICD-10-CM | POA: Insufficient documentation

## 2013-06-15 DIAGNOSIS — I509 Heart failure, unspecified: Secondary | ICD-10-CM | POA: Insufficient documentation

## 2013-06-15 DIAGNOSIS — Z9861 Coronary angioplasty status: Secondary | ICD-10-CM

## 2013-06-15 DIAGNOSIS — I209 Angina pectoris, unspecified: Secondary | ICD-10-CM | POA: Insufficient documentation

## 2013-06-15 DIAGNOSIS — I5042 Chronic combined systolic (congestive) and diastolic (congestive) heart failure: Secondary | ICD-10-CM | POA: Insufficient documentation

## 2013-06-15 DIAGNOSIS — I2510 Atherosclerotic heart disease of native coronary artery without angina pectoris: Secondary | ICD-10-CM | POA: Insufficient documentation

## 2013-06-15 HISTORY — PX: PERCUTANEOUS STENT INTERVENTION: SHX5500

## 2013-06-15 HISTORY — DX: Malignant neoplasm of prostate: C61

## 2013-06-15 HISTORY — DX: Acute myocardial infarction, unspecified: I21.9

## 2013-06-15 HISTORY — PX: LEFT HEART CATHETERIZATION WITH CORONARY ANGIOGRAM: SHX5451

## 2013-06-15 HISTORY — DX: Atherosclerotic heart disease of native coronary artery without angina pectoris: I25.10

## 2013-06-15 HISTORY — DX: Unspecified malignant neoplasm of skin, unspecified: C44.90

## 2013-06-15 LAB — POCT ACTIVATED CLOTTING TIME
ACTIVATED CLOTTING TIME: 171 s
Activated Clotting Time: 393 seconds

## 2013-06-15 SURGERY — LEFT HEART CATHETERIZATION WITH CORONARY ANGIOGRAM
Anesthesia: LOCAL

## 2013-06-15 MED ORDER — BIVALIRUDIN 250 MG IV SOLR
INTRAVENOUS | Status: AC
  Filled 2013-06-15: qty 250

## 2013-06-15 MED ORDER — LOPERAMIDE HCL 2 MG PO CAPS: 2.0000 mg | ORAL_CAPSULE | ORAL | Status: DC | PRN

## 2013-06-15 MED ORDER — SODIUM CHLORIDE 0.9 % IV SOLN: 250.0000 mL | INTRAVENOUS | Status: DC | PRN

## 2013-06-15 MED ORDER — PRASUGREL HCL 10 MG PO TABS
10.0000 mg | ORAL_TABLET | Freq: Every day | ORAL | Status: DC
  Administered 2013-06-16: 11:00:00 10 mg via ORAL
  Filled 2013-06-15 (×2): qty 1

## 2013-06-15 MED ORDER — ONDANSETRON HCL 4 MG/2ML IJ SOLN: 4.0000 mg | Freq: Four times a day (QID) | INTRAMUSCULAR | Status: DC | PRN

## 2013-06-15 MED ORDER — MOMETASONE FURO-FORMOTEROL FUM 200-5 MCG/ACT IN AERO
2.0000 | INHALATION_SPRAY | Freq: Two times a day (BID) | RESPIRATORY_TRACT | Status: DC
  Filled 2013-06-15: qty 8.8

## 2013-06-15 MED ORDER — ASPIRIN 81 MG PO CHEW
81.0000 mg | CHEWABLE_TABLET | ORAL | Status: AC
  Administered 2013-06-15: 81 mg via ORAL
  Filled 2013-06-15: qty 1

## 2013-06-15 MED ORDER — HEPARIN (PORCINE) IN NACL 2-0.9 UNIT/ML-% IJ SOLN
INTRAMUSCULAR | Status: AC
  Filled 2013-06-15: qty 1000

## 2013-06-15 MED ORDER — PRAMOXINE-ZINC OXIDE IN MO 1-12.5 % RE OINT
1.0000 "application " | TOPICAL_OINTMENT | Freq: Three times a day (TID) | RECTAL | Status: DC | PRN
  Filled 2013-06-15: qty 28.3

## 2013-06-15 MED ORDER — SODIUM CHLORIDE 0.9 % IJ SOLN: 3.0000 mL | INTRAMUSCULAR | Status: DC | PRN

## 2013-06-15 MED ORDER — GUAIFENESIN-DM 100-10 MG/5ML PO SYRP: 15.0000 mL | ORAL_SOLUTION | ORAL | Status: DC | PRN

## 2013-06-15 MED ORDER — ATROPINE SULFATE 0.1 MG/ML IJ SOLN
INTRAMUSCULAR | Status: AC
  Filled 2013-06-15: qty 10

## 2013-06-15 MED ORDER — CALCIUM CARBONATE-VITAMIN D 500-200 MG-UNIT PO TABS
2.0000 | ORAL_TABLET | Freq: Every day | ORAL | Status: DC
Start: 2013-06-15 — End: 2013-06-16
  Administered 2013-06-15 – 2013-06-16 (×2): 2 via ORAL
  Filled 2013-06-15 (×2): qty 2

## 2013-06-15 MED ORDER — PRASUGREL HCL 10 MG PO TABS
ORAL_TABLET | ORAL | Status: AC
  Filled 2013-06-15: qty 1

## 2013-06-15 MED ORDER — FENTANYL CITRATE 0.05 MG/ML IJ SOLN
INTRAMUSCULAR | Status: AC
  Filled 2013-06-15: qty 2

## 2013-06-15 MED ORDER — ONDANSETRON HCL 4 MG/2ML IJ SOLN
INTRAMUSCULAR | Status: AC
  Administered 2013-06-15: 10:00:00 4 mg
  Filled 2013-06-15: qty 2

## 2013-06-15 MED ORDER — NITROGLYCERIN 0.2 MG/ML ON CALL CATH LAB
INTRAVENOUS | Status: AC
  Filled 2013-06-15: qty 1

## 2013-06-15 MED ORDER — TRIAMTERENE-HCTZ 37.5-25 MG PO CAPS
1.0000 | ORAL_CAPSULE | Freq: Every day | ORAL | Status: DC
  Administered 2013-06-16: 1 via ORAL
  Filled 2013-06-15: qty 1

## 2013-06-15 MED ORDER — ALUM & MAG HYDROXIDE-SIMETH 200-200-20 MG/5ML PO SUSP
30.0000 mL | ORAL | Status: DC | PRN
  Administered 2013-06-15: 13:00:00 30 mL via ORAL
  Filled 2013-06-15: qty 30

## 2013-06-15 MED ORDER — ISOSORBIDE MONONITRATE ER 60 MG PO TB24
60.0000 mg | ORAL_TABLET | Freq: Every day | ORAL | Status: DC
  Administered 2013-06-16: 60 mg via ORAL
  Filled 2013-06-15: qty 1

## 2013-06-15 MED ORDER — MIDAZOLAM HCL 2 MG/2ML IJ SOLN
INTRAMUSCULAR | Status: AC
  Filled 2013-06-15: qty 2

## 2013-06-15 MED ORDER — DIAZEPAM 2 MG PO TABS: 2.0000 mg | ORAL_TABLET | Freq: Two times a day (BID) | ORAL | Status: DC | PRN

## 2013-06-15 MED ORDER — ADULT MULTIVITAMIN W/MINERALS CH
1.0000 | ORAL_TABLET | Freq: Every day | ORAL | Status: DC
  Administered 2013-06-15 – 2013-06-16 (×2): 1 via ORAL
  Filled 2013-06-15 (×2): qty 1

## 2013-06-15 MED ORDER — LEVOTHYROXINE SODIUM 75 MCG PO TABS
75.0000 ug | ORAL_TABLET | Freq: Every day | ORAL | Status: DC
  Administered 2013-06-16: 75 ug via ORAL
  Filled 2013-06-15 (×2): qty 1

## 2013-06-15 MED ORDER — ONDANSETRON HCL 4 MG/2ML IJ SOLN: 4.0000 mg | Freq: Four times a day (QID) | INTRAMUSCULAR | Status: DC

## 2013-06-15 MED ORDER — MAGNESIUM HYDROXIDE 400 MG/5ML PO SUSP: 30.0000 mL | Freq: Every day | ORAL | Status: DC | PRN

## 2013-06-15 MED ORDER — SODIUM CHLORIDE 0.9 % IJ SOLN: 3.0000 mL | Freq: Two times a day (BID) | INTRAMUSCULAR | Status: DC

## 2013-06-15 MED ORDER — OMEGA-3-ACID ETHYL ESTERS 1 G PO CAPS
1.0000 g | ORAL_CAPSULE | Freq: Two times a day (BID) | ORAL | Status: DC
  Administered 2013-06-15 – 2013-06-16 (×3): 1 g via ORAL
  Filled 2013-06-15 (×4): qty 1

## 2013-06-15 MED ORDER — VERAPAMIL HCL 2.5 MG/ML IV SOLN
INTRAVENOUS | Status: AC
  Filled 2013-06-15: qty 2

## 2013-06-15 MED ORDER — ATORVASTATIN CALCIUM 10 MG PO TABS
10.0000 mg | ORAL_TABLET | Freq: Every day | ORAL | Status: DC
  Administered 2013-06-15: 20:00:00 10 mg via ORAL
  Filled 2013-06-15 (×2): qty 1

## 2013-06-15 MED ORDER — LIDOCAINE HCL (PF) 1 % IJ SOLN
INTRAMUSCULAR | Status: AC
  Filled 2013-06-15: qty 30

## 2013-06-15 MED ORDER — ONDANSETRON HCL 4 MG/2ML IJ SOLN
4.0000 mg | Freq: Four times a day (QID) | INTRAMUSCULAR | Status: DC | PRN
  Administered 2013-06-15: 4 mg via INTRAVENOUS

## 2013-06-15 MED ORDER — SODIUM CHLORIDE 0.9 % IV SOLN
INTRAVENOUS | Status: AC
  Administered 2013-06-15: 10:00:00 75 mL/h via INTRAVENOUS

## 2013-06-15 MED ORDER — NITROGLYCERIN 0.4 MG SL SUBL: 0.4000 mg | SUBLINGUAL_TABLET | SUBLINGUAL | Status: DC | PRN

## 2013-06-15 MED ORDER — ACETAMINOPHEN 325 MG PO TABS
325.0000 mg | ORAL_TABLET | Freq: Four times a day (QID) | ORAL | Status: DC | PRN
  Administered 2013-06-15: 650 mg via ORAL
  Filled 2013-06-15: qty 2

## 2013-06-15 MED ORDER — MONTELUKAST SODIUM 10 MG PO TABS
10.0000 mg | ORAL_TABLET | Freq: Every day | ORAL | Status: DC
  Administered 2013-06-15: 10 mg via ORAL
  Filled 2013-06-15 (×2): qty 1

## 2013-06-15 MED ORDER — HEPARIN SODIUM (PORCINE) 1000 UNIT/ML IJ SOLN
INTRAMUSCULAR | Status: AC
  Filled 2013-06-15: qty 1

## 2013-06-15 MED ORDER — ASPIRIN EC 81 MG PO TBEC
81.0000 mg | DELAYED_RELEASE_TABLET | Freq: Every day | ORAL | Status: DC
  Administered 2013-06-15 – 2013-06-16 (×2): 81 mg via ORAL
  Filled 2013-06-15 (×2): qty 1

## 2013-06-15 MED ORDER — METOPROLOL TARTRATE 25 MG PO TABS
25.0000 mg | ORAL_TABLET | Freq: Two times a day (BID) | ORAL | Status: DC
  Administered 2013-06-15 – 2013-06-16 (×2): 25 mg via ORAL
  Filled 2013-06-15 (×3): qty 1

## 2013-06-15 MED ORDER — ALBUTEROL SULFATE (2.5 MG/3ML) 0.083% IN NEBU
3.0000 mL | INHALATION_SOLUTION | Freq: Four times a day (QID) | RESPIRATORY_TRACT | Status: DC | PRN
  Administered 2013-06-15: 3 mL via RESPIRATORY_TRACT
  Filled 2013-06-15: qty 3

## 2013-06-15 MED ORDER — SODIUM CHLORIDE 0.9 % IV SOLN
INTRAVENOUS | Status: DC
  Administered 2013-06-15: 06:00:00 via INTRAVENOUS

## 2013-06-15 NOTE — Care Management Note (Addendum)
  Page 2 of 2   06/17/2013     3:55:01 PM CARE MANAGEMENT NOTE 06/17/2013  Patient:  Joe Soto, Joe Soto   Account Number:  [id number]  Date Initiated:  06/15/2013  Documentation initiated by:  Abbye Lao  Subjective/Objective Assessment:   Patient admitted with abnormal stress test     Action/Plan:   CM to follow for disposition needs   Anticipated DC Date:  06/16/2013   Anticipated DC Plan:  [place]  CM consult  Medication Assistance      Choice offered to / List presented to:             Status of service:  Completed, signed off Medicare Important Message given?   (If response is "NO", the following Medicare IM given date fields will be blank) Date Medicare IM given:   Date Additional Medicare IM given:    Discharge Disposition:  HOME/SELF CARE  Per UR Regulation:    If discussed at Long Length of Stay Meetings, dates discussed:    Comments:  Aaima Gaddie RN, BSN, MSHL, CCM  Nurse - Case Manager, (Unit [phone]  06/17/2013 CM left message at members home # [phone] and cell # [phone] (Voice mail ID Titus Dubin)  requesting call back. Plan:  Verify patient is active with [facility] as Health Care Provider has indicated with 2nd consultation call.  CM would like to confirm patient has medication to cover until next refill date on 07/11/2013    Mariann Laster RN, BSN, MSHL, CCM  Nurse - Case Manager, (Unit [phone]  06/16/2013 OPIB Effient Outcome:  Hx/o patient has already purchased medication. Update from unit nurse / Mickel Baas:  patient confirms he is not on Effient new benefits check sent.  PRIOR AUTH #[id number] ---06/16/2013 1521 by NIA SHEALY--- PER INSURANCE COMPANY SOMEONE RAN THE MEDS AND ITS SHOWING HE ALREADY HAS IT AND CANNOT GET ANOTHER REFILL UNTIL 6/27- COPAY FOR THE MED IS $59.84  Enzo Treu RN, BSN, MSHL, CCM  Nurse - Case Manager, (Unit [phone]   06/15/2013 Benefits Outcome:  PT ALREADY PURCHASED MEDICATION Benefits Check: prasugrel (EFFIENT) tablet 10 mg  :  Dose 10 mg  :  Oral  : Daily Please check coverage, co-pay, authorization, deductibles and preferred pharmacy

## 2013-06-15 NOTE — H&P (Signed)
  Please see office visit notes for complete details of HPI.  

## 2013-06-15 NOTE — Progress Notes (Signed)
TR BAND REMOVAL  LOCATION:  right radial  DEFLATED PER PROTOCOL:  yes  TIME BAND OFF / DRESSING APPLIED:   1400   SITE UPON ARRIVAL:   Level 1  SITE AFTER BAND REMOVAL:  Level 2  REVERSE ALLEN'S TEST:    positive  CIRCULATION SENSATION AND MOVEMENT:  Within Normal Limits  yes  COMMENTS:  Bruised

## 2013-06-15 NOTE — Interval H&P Note (Signed)
History and Physical Interval Note:  06/15/2013 7:51 AM  Joe Soto  has presented today for surgery, with the diagnosis of abnormal stress test  The various methods of treatment have been discussed with the patient and family. After consideration of risks, benefits and other options for treatment, the patient has consented to  Procedure(s): LEFT HEART CATHETERIZATION WITH CORONARY ANGIOGRAM (N/A) as a surgical intervention .  The patient's history has been reviewed, patient examined, no change in status, stable for surgery.  I have reviewed the patient's chart and labs.  Questions were answered to the patient's satisfaction.    Patient consents to having students observing: Raoul Pitch, and [place]

## 2013-06-15 NOTE — CV Procedure (Signed)
Procedure performed:  Left heart catheterization including hemodynamic monitoring of the left ventricle. Selective right and left coronary arteriography. PTCA and stenting of the proximal LAD with implantation of a 2.5 x 18 mm Xience Alpine DES.  Indication: Patient is a 80 year-old male with history of hyperlipidemia, who presents with Class III angina pectoris and CHF. Patient has  had non invasive testing which was high risk and abnormal.  Hence is brought to the cardiac catheterization lab to evaluate  coronary anatomy for definitive diagnosis of CAD.  Hemodynamic data: Left ventricular pressure was 120/13 with LVEDP of 25 mm mercury. Aortic pressure was 121/68 with a mean of 88 mm mercury. There was no pressure gradient across the aortic valve.    Angiographic data:  Left ventricle: Not Performed, out patient EF 35% by echo and 15% by stress testing.  Right coronary artery: The vessel is large vessel, codominant circumflex coronary artery, mild diffuse disease is evident. No high-grade stenosis.  Left main coronary artery is large and normal. Mild calcification evident.  Circumflex coronary artery: A large vessel giving origin to a large obtuse marginal 1, OM 2, again mild to moderate diffuse disease is evident in these vessels but no high-grade stenosis. OM1 proximal segment has a 30% stenosis, OM 2 proximal to mid segment 20-30% diffuse disease.  LAD:  LAD gives origin to a moderate sized diagonal-1 and several small diagonals. LAD is severely diffusely diseased, it is a small calibered vessel. Moderate aortic calcification is evident. Proximal LAD shows a high-grade subtotally occluded 99% stenosis with TIMI 2 flow. The rest of the LAD again is very small calibered vessel, midsegment has 30-40% diffuse disease.  Impression: High-grade critical single-vessel proximal LAD stenosis, mild to moderate disease noncritical in circumflex right coronary artery.  Interventional data:  PTCA and  stenting of the proximal LAD with implantation of a 2.5 x 18 mm Xience Alpine DES. Will need Dual antiplatelet therapy with  Effient and ASA 81 mg for at least  1 year. Using Effient due to ostial LAD stenosis and patient's symptoms class 3-4. I may consider switching to Plavix after 4-6 weeks of therapy .   Technique of diagnostic cardiac catheterization:  Under sterile precautions using a 6 French right radial  arterial access, a 6 French sheath was introduced into the right radial artery. A 5 [place] Tig 4 catheter was advanced into the ascending aorta selective  right coronary artery and left coronary artery was cannulated and angiography was performed in multiple views. The catheter was pulled back Out of the body over exchange length J-wire.  same Catheter was used to perform LV hemodynamics . Catheter exchanged out of the body over J-Wire. NO immediate complications noted.  Patient tolerated the procedure well.   Technique of intervention:  Using a 6 [place] XB 3.5 guide catheter the cougar 0.014 by her 90 cm, left main  coronary  was selected and cannulated. Using Angiomax for anticoagulation, I utilized a cougar guidewire and across the LAD coronary artery with moderate amount of difficulty due to high-grade stenosis and presence of side branch. I placed the tip of the wire into the distal  coronary artery. Angiography was performed. Intracoronary nitroglycerin was administered both during diagnostic angiography and also during interventions.  Then I utilized a 2.0 x 15 mm Euphora balloon , I performed balloon angioplasty at 12 atmospheric pressure for 10 seconds followed by a second inflation at 14 atmospheric pressure for 60 seconds. This was followed by angiography which revealed  excellent results. I proceeded with implantation of a 2.5 x 18 mm Xience Alpine  drug-eluting stent into the proximal LAD coronary artery. The stent was deployed at 10 atmospheric pressure for 60  seconds. The stent was  then post dilated with a 2.5 x 12 mm [place] Euphora balloon at 14 atmospheric pressure for 60 seconds. Post-balloon angioplasty results were excellent with 0% residual stenoses and TIMI-3 flow was maintained. There was no evidence of edge dissection. The guidewire was withdrawn out of the body and the guide catheter was engaged and pulled out of the body over the J-wire the was no immediate complication. Hemostasis achieved with TR band. Patient tolerated the procedure well.  Disposition: Patient will be discharged in  morning  unless complications with out-patient follow up. A total of 140 cc of contrast was utilized for diagnostic and interventional procedure.

## 2013-06-16 ENCOUNTER — Encounter (HOSPITAL_COMMUNITY): Payer: Self-pay | Admitting: *Deleted

## 2013-06-16 HISTORY — PX: CORONARY ANGIOPLASTY WITH STENT PLACEMENT: SHX49

## 2013-06-16 LAB — BASIC METABOLIC PANEL
BUN: 31 mg/dL — ABNORMAL HIGH (ref 6–23)
BUN: 30 mg/dL — ABNORMAL HIGH (ref 6–23)
CHLORIDE: 105 meq/L (ref 96–112)
CO2: 24 mEq/L (ref 19–32)
CO2: 22 mEq/L (ref 19–32)
Calcium: 9.1 mg/dL (ref 8.4–10.5)
Calcium: 9 mg/dL (ref 8.4–10.5)
Chloride: 105 mEq/L (ref 96–112)
Creatinine, Ser: 1.59 mg/dL — ABNORMAL HIGH (ref 0.50–1.35)
Creatinine, Ser: 1.48 mg/dL — ABNORMAL HIGH (ref 0.50–1.35)
GFR calc Af Amer: 47 mL/min — ABNORMAL LOW (ref >90)
GFR calc non Af Amer: 40 mL/min — ABNORMAL LOW (ref >90)
GFR, EST AFRICAN AMERICAN: 43 mL/min — AB (ref >90)
GFR, EST NON AFRICAN AMERICAN: 37 mL/min — AB (ref >90)
GLUCOSE: 110 mg/dL — AB (ref 70–99)
Glucose, Bld: 101 mg/dL — ABNORMAL HIGH (ref 70–99)
POTASSIUM: 5.4 meq/L — AB (ref 3.7–5.3)
POTASSIUM: 4.5 meq/L (ref 3.7–5.3)
Sodium: 139 mEq/L (ref 137–147)
Sodium: 140 mEq/L (ref 137–147)

## 2013-06-16 LAB — CBC
HCT: 27.3 % — ABNORMAL LOW (ref 39.0–52.0)
Hemoglobin: 9.4 g/dL — ABNORMAL LOW (ref 13.0–17.0)
MCH: 37.2 pg — ABNORMAL HIGH (ref 26.0–34.0)
MCHC: 34.4 g/dL (ref 30.0–36.0)
MCV: 107.9 fL — ABNORMAL HIGH (ref 78.0–100.0)
Platelets: 216 10*3/uL (ref 150–400)
RBC: 2.53 MIL/uL — AB (ref 4.22–5.81)
RDW: 14.5 % (ref 11.5–15.5)
WBC: 5.3 10*3/uL (ref 4.0–10.5)

## 2013-06-16 MED ORDER — PRASUGREL HCL 10 MG PO TABS: 10.0000 mg | ORAL_TABLET | Freq: Every day | ORAL | Status: DC

## 2013-06-16 MED ORDER — BIOTENE DRY MOUTH MT LIQD
15.0000 mL | Freq: Two times a day (BID) | OROMUCOSAL | Status: DC
  Administered 2013-06-16: 09:00:00 15 mL via OROMUCOSAL

## 2013-06-16 MED FILL — Sodium Chloride IV Soln 0.9%: INTRAVENOUS | Qty: 50 | Status: AC

## 2013-06-16 NOTE — Discharge Instructions (Signed)
Angina Pectoris  Angina pectoris, often just called angina, is extreme discomfort in your chest, neck, or arm caused by a lack of blood in the middle and thickest layer of your heart wall (myocardium). It may feel like tightness or heavy pressure. It may feel like a crushing or squeezing pain. Some people say it feels like gas or indigestion. It may go down your shoulders, back, and arms. Some people may have symptoms other than pain. These symptoms include fatigue, shortness of breath, cold sweats, or nausea. There are four different types of angina:   Stable angina Stable angina usually occurs in episodes of predictable frequency and duration. It usually is brought on by physical activity, emotional stress, or excitement. These are all times when the myocardium needs more oxygen. Stable angina usually lasts a few minutes and often is relieved by taking a medicine that can be taken under your tongue (sublingually). The medicine is called nitroglycerin. Stable angina is caused by a buildup of plaque inside the arteries, which restricts blood flow to the heart muscle (atherosclerosis).   Unstable angina Unstable angina can occur even when your body experiences little or no physical exertion. It can occur during sleep. It can also occur at rest. It can suddenly increase in severity or frequency. It might not be relieved by sublingual nitroglycerin. It can last up to 30 minutes. The most common cause of unstable angina is a blood clot that has developed on the top of plaque buildup inside a coronary artery. It can lead to a heart attack if the blood clot completely blocks the artery.   Microvascular angina This type of angina is caused by a disorder of tiny blood vessels called arterioles. Microvascular angina is more common in women. The pain may be more severe and last longer than other types of angina pectoris.   Prinzmetal or variant angina This type of angina pectoris usually occurs when your body experiences  little or no physical exertion. It especially occurs in the early morning hours. It is caused by a spasm of your coronary artery.  HOME CARE INSTRUCTIONS    Only take over-the-counter and prescription medicines as directed by your caregiver.   Stay active or increase your exercise as directed by your caregiver.   Limit strenuous activity as directed by your caregiver.   Limit heavy lifting as directed by your caregiver.   Maintain a healthy weight.   Learn about and eat heart-healthy foods.   Do not smoke.  SEEK IMMEDIATE MEDICAL CARE IF:   You experience the following symptoms:   Chest, neck, deep shoulder, or arm pain or discomfort that lasts more than a few minutes.   Chest, neck, deep shoulder, or arm pain or discomfort that goes away and comes back, repeatedly.   Heavy sweating with discomfort, without a noticeable cause.   Shortness of breath or difficulty breathing.   Angina that does not get better after a few minutes of rest or after taking sublingual nitroglycerin.  These can all be symptoms of a heart attack, which is a medical emergency! Get medical help at once. Call your local emergency service (911 in [place] immediately. Do not  drive yourself to the hospital and do not  wait to for your symptoms to go away.  MAKE SURE YOU:   Understand these instructions.   Will watch your condition.   Will get help right away if you are not doing well or get worse.  Document Released: 01/01/2005 Document Revised: 12/19/2011 Document Reviewed: 

## 2013-06-16 NOTE — Discharge Summary (Signed)
Physician Discharge Summary  Patient ID: Joe Soto MRN: [id number] DOB/AGE: Nov 04, 1923 80 y.o.  Admit date: 06/15/2013 Discharge date: 06/16/2013  Primary Discharge Diagnosis 1. Coronary artery disease of the native vessel 2. PTCA and stenting of the proximal LAD with implantation of a 2.5 x 18 mm Xience Alpine DS 06/15/2013.  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 Severe LV systolic dysfunction by nuclear stress test. Hyperlipidemia Acute renal failure, improving, probably related to contrast.  Significant Diagnostic Studies: 06/15/2013: Left heart catheterization including hemodynamic monitoring of the left ventricle. Selective right and left coronary arteriography. PTCA and stenting of the proximal LAD with implantation of a 2.5 x 18 mm Xience Alpine DES.  Indication: Patient is a 80 year-old male with history of hyperlipidemia, who presents with Class III angina pectoris and CHF. Patient has had non invasive testing which was high risk and abnormal. Hence is brought to the cardiac catheterization lab to evaluate coronary anatomy for definitive diagnosis of CAD.  Hemodynamic data:  Left ventricular pressure was 120/13 with LVEDP of 25 mm mercury. Aortic pressure was 121/68 with a mean of 88 mm mercury. There was no pressure gradient across the aortic valve.  Angiographic data:  Left ventricle: Not Performed, out patient EF 35% by echo and 15% by stress testing.  Right coronary artery: The vessel is large vessel, codominant circumflex coronary artery, mild diffuse disease is evident. No high-grade stenosis.  Left main coronary artery is large and normal. Mild calcification evident.  Circumflex coronary artery: A large vessel giving origin to a large obtuse marginal 1, OM 2, again mild to moderate diffuse disease  is evident in these vessels but no high-grade stenosis. OM1 proximal segment has a 30% stenosis, OM 2 proximal to mid segment 20-30% diffuse disease.  LAD: LAD gives origin to a moderate sized diagonal-1 and several small diagonals. LAD is severely diffusely diseased, it is a small calibered vessel. Moderate aortic calcification is evident. Proximal LAD shows a high-grade subtotally occluded 99% stenosis with TIMI 2 flow. The rest of the LAD again is very small calibered vessel, midsegment has 30-40% diffuse disease.  Impression: High-grade critical single-vessel proximal LAD stenosis, mild to moderate disease noncritical in circumflex right coronary artery.  Interventional data: PTCA and stenting of the proximal LAD with implantation of a 2.5 x 18 mm Xience Alpine DES. Hospital Course: Patient was admitted for coronary angiography in an elective fashion, underwent angiography and successful angioplasty to the LAD. The following morning his serum creatinine had risen suggestive of acute renal failure, hence a repeat BMP was obtained 5 hours later which revealed serum creatinine trending down. Hence patient was felt stable for discharge, remained angina free and no other specific complaints.   Recommendations on discharge: Patient will be continued on aspirin 81 mg along with Effient 10 mg by mouth daily. Given his proximal LAD stenosis, we used Effient, we'll try to use it at least for 4-6 weeks prior to switching to Plavix. He will need BMP in 4 days in the outpatient setting which has been arranged.  Discharge Exam: Blood pressure 115/48, pulse 66, temperature 97.6 F (36.4 C), temperature source Oral, resp. rate 18, height 5\' 10"  (1.778 m), weight 68.7 kg (151 lb 7.3 oz), SpO2 95.00%.   General appearance: alert, cooperative, appears stated age and no distress Resp: clear to auscultation bilaterally Cardio: regular rate and  rhythm, S1, S2 normal, no murmur, click, rub or gallop and Frequent  ectopy GI: soft, non-tender; bowel sounds normal; no masses,  no organomegaly Pulses: 2+ and symmetric Neurologic: Grossly normal Right radial arterial access site without any complication.  Labs:   Lab Results  Component Value Date   WBC 5.3 06/16/2013   HGB 9.4* 06/16/2013   HCT 27.3* 06/16/2013   MCV 107.9* 06/16/2013   PLT 216 06/16/2013    Recent Labs Lab 06/16/13 1045  NA 140  K 4.5  CL 105  CO2 22  BUN 30*  CREATININE 1.48*  CALCIUM 9.0  GLUCOSE 101*   No results found for this basename: CKTOTAL, CKMB, CKMBINDEX, TROPONINI    Lipid Panel  No results found for this basename: chol, trig, hdl, cholhdl, vldl, ldlcalc    EKG: 06/16/2013: Normal sinus rhythm, left atrial enlargement, rightward axis, right bundle branch block, frequent PVCs in a pattern of ventricular bigeminy. No obvious ischemia, however ventricular bigeminy makes interpretation difficult Radiology: No results found.    FOLLOW UP PLANS AND APPOINTMENTS Discharge Instructions   Amb Referral to Cardiac Rehabilitation    Complete by:  As directed             Medication List    STOP taking these medications       Fish Oil 1200 MG Caps      TAKE these medications       aspirin EC 81 MG tablet  Take 81 mg by mouth daily.     atorvastatin 10 MG tablet  Commonly known as:  LIPITOR  Take 10 mg by mouth daily.     calcium-vitamin D 500-200 MG-UNIT per tablet  Commonly known as:  OSCAL WITH D  Take 2 tablets by mouth daily. 600 mg+d3 400iu     fluticasone-salmeterol 230-21 MCG/ACT inhaler  Commonly known as:  ADVAIR HFA  Inhale 2 puffs into the lungs 2 (two) times daily.     isosorbide mononitrate 60 MG 24 hr tablet  Commonly known as:  IMDUR  Take 60 mg by mouth daily.     levothyroxine 75 MCG tablet  Commonly known as:  SYNTHROID, LEVOTHROID  Take 75 mcg by mouth daily.     metoprolol tartrate 25 MG tablet  Commonly known as:  LOPRESSOR  Take 25 mg by mouth 2 (two) times daily.      multivitamin with minerals Tabs tablet  Take 1 tablet by mouth daily.     nitroGLYCERIN 0.4 MG SL tablet  Commonly known as:  NITROSTAT  Place 0.4 mg under the tongue every 5 (five) minutes as needed for chest pain.     prasugrel 10 MG Tabs tablet  Commonly known as:  EFFIENT  Take 1 tablet (10 mg total) by mouth daily.     PROVENTIL HFA 108 (90 BASE) MCG/ACT inhaler  Generic drug:  albuterol  Inhale 2 puffs into the lungs.     triamterene-hydrochlorothiazide 37.5-25 MG per capsule  Commonly known as:  DYAZIDE  Take 1 capsule by mouth daily.     Vitamin D 2000 UNITS Caps  Take 2,000 Units by mouth daily.     zafirlukast 20 MG tablet  Commonly known as:  ACCOLATE  Take 20 mg by mouth at bedtime.           Follow-up Information   Follow up with Laverda Page, MD. (Keep previous appointment. Need to have Labs done on 06/20/2013 at [facility]-Orders are in.)    Specialty:  Cardiology  Contact information:   [address] [place] [address] [phone]        Laverda Page, MD 06/16/2013, 3:39 PM  Pager: [phone] Office: [phone] If no answer: [phone]

## 2013-06-16 NOTE — Progress Notes (Signed)
CARDIAC REHAB PHASE I   PRE:  Rate/Rhythm: 25 Sr with PVC's  BP:  Supine: 139/40  Sitting:    Standing:    SaO2: 100 RA  MODE:  Ambulation: 1000 ft   POST:  Rate/Rhythm: 79 SR with PVC's  BP:  Supine:   Sitting: 120/60  Standing:    SaO2: 96 RA 0800-0930  Pt tolerated ambulation well without c/o of cp or SOB. VS stable. Completed stent and CHF education with pt and wife. They voice understanding. Pt agrees to [place]. CRP in [place], will send referral.  Rodney Langton RN 06/16/2013 10:11 AM

## 2013-07-02 ENCOUNTER — Encounter (HOSPITAL_COMMUNITY)
Admission: RE | Admit: 2013-07-02 | Discharge: 2013-07-02 | Disposition: A | Discharge status: Home / Self Care | Payer: PRIVATE HEALTH INSURANCE | Source: Ambulatory Visit | Attending: Cardiology | Admitting: Cardiology

## 2013-07-02 ENCOUNTER — Other Ambulatory Visit: Payer: Self-pay | Admitting: Urology

## 2013-07-02 DIAGNOSIS — Z5189 Encounter for other specified aftercare: Secondary | ICD-10-CM | POA: Insufficient documentation

## 2013-07-02 DIAGNOSIS — E785 Hyperlipidemia, unspecified: Secondary | ICD-10-CM | POA: Insufficient documentation

## 2013-07-02 DIAGNOSIS — M810 Age-related osteoporosis without current pathological fracture: Secondary | ICD-10-CM

## 2013-07-02 DIAGNOSIS — I10 Essential (primary) hypertension: Secondary | ICD-10-CM | POA: Insufficient documentation

## 2013-07-02 DIAGNOSIS — Z9861 Coronary angioplasty status: Secondary | ICD-10-CM | POA: Insufficient documentation

## 2013-07-02 DIAGNOSIS — I2510 Atherosclerotic heart disease of native coronary artery without angina pectoris: Secondary | ICD-10-CM | POA: Insufficient documentation

## 2013-07-02 NOTE — Progress Notes (Signed)
Cardiac Rehab Medication Review by a Pharmacist  Does the patient  feel that his/her medications are working for him/her?  yes  Has the patient been experiencing any side effects to the medications prescribed?  no  Does the patient measure his/her own blood pressure or blood glucose at home?  No, though patient reports SBPs in the 160s in clinic   Does the patient have any problems obtaining medications due to transportation or finances?   no  Understanding of regimen: good Understanding of indications: good Potential of compliance: good    Pharmacist comments:  5 yoM presents in very good spirits this morning. When asked how patient manages his medications, he reports placing his pill bottles next to him during breakfast and opening each of them daily. I have encouraged patient to use his pillboxes to further improve compliance and ease of administration. Patient reports no complaints or concerns at this time. I have updated all of his medications and allergies. Of note, normal dose for zafirkulast is 20mg  twice daily, but patient denies any shortness of breath and reports feeling very good.    Oval Linsey. Leitha Schuller, PharmD Clinical Pharmacist - Resident Phone: [phone] Pager: [phone] 07/02/2013 8:54 AM

## 2013-07-06 ENCOUNTER — Encounter (HOSPITAL_COMMUNITY): Payer: PRIVATE HEALTH INSURANCE

## 2013-07-08 ENCOUNTER — Encounter (HOSPITAL_COMMUNITY): Payer: Self-pay

## 2013-07-08 ENCOUNTER — Encounter (HOSPITAL_COMMUNITY)
Admission: RE | Admit: 2013-07-08 | Discharge: 2013-07-08 | Disposition: A | Discharge status: Home / Self Care | Payer: PRIVATE HEALTH INSURANCE | Source: Ambulatory Visit | Attending: Cardiology | Admitting: Cardiology

## 2013-07-08 DIAGNOSIS — Z5189 Encounter for other specified aftercare: Secondary | ICD-10-CM | POA: Diagnosis present

## 2013-07-08 DIAGNOSIS — E785 Hyperlipidemia, unspecified: Secondary | ICD-10-CM | POA: Diagnosis not present

## 2013-07-08 DIAGNOSIS — I10 Essential (primary) hypertension: Secondary | ICD-10-CM | POA: Diagnosis not present

## 2013-07-08 DIAGNOSIS — I2510 Atherosclerotic heart disease of native coronary artery without angina pectoris: Secondary | ICD-10-CM | POA: Diagnosis present

## 2013-07-08 DIAGNOSIS — Z9861 Coronary angioplasty status: Secondary | ICD-10-CM | POA: Diagnosis not present

## 2013-07-08 NOTE — Progress Notes (Signed)
Pt started cardiac rehab today.  Pt tolerated light exercise without difficulty.  VSS, telemetry-sinus rhythm, frequent unifocal PVC, bigeminal and trigeminal at times, occ. Ventricular couplets.  PC to Dr. Irven Shelling nurse, strips faxed to Dr. Einar Gip office for review.  Pt asymptomatic.  PHQ-0.  Pt did not complete his homework packet.  Pt given instructions to fill out and return to next visit.  Pt oriented to exercise equipment and routine.  Understanding verbalized.

## 2013-07-10 ENCOUNTER — Encounter (HOSPITAL_COMMUNITY)
Admission: RE | Admit: 2013-07-10 | Discharge: 2013-07-10 | Disposition: A | Discharge status: Home / Self Care | Payer: PRIVATE HEALTH INSURANCE | Source: Ambulatory Visit | Attending: Cardiology | Admitting: Cardiology

## 2013-07-10 DIAGNOSIS — Z5189 Encounter for other specified aftercare: Secondary | ICD-10-CM | POA: Diagnosis not present

## 2013-07-13 ENCOUNTER — Encounter (HOSPITAL_COMMUNITY): Payer: PRIVATE HEALTH INSURANCE

## 2013-07-15 ENCOUNTER — Encounter (HOSPITAL_COMMUNITY)
Admission: RE | Admit: 2013-07-15 | Discharge: 2013-07-15 | Disposition: A | Discharge status: Home / Self Care | Payer: PRIVATE HEALTH INSURANCE | Source: Ambulatory Visit | Attending: Cardiology | Admitting: Cardiology

## 2013-07-15 DIAGNOSIS — I10 Essential (primary) hypertension: Secondary | ICD-10-CM | POA: Insufficient documentation

## 2013-07-15 DIAGNOSIS — Z9861 Coronary angioplasty status: Secondary | ICD-10-CM | POA: Insufficient documentation

## 2013-07-15 DIAGNOSIS — Z5189 Encounter for other specified aftercare: Secondary | ICD-10-CM | POA: Insufficient documentation

## 2013-07-15 DIAGNOSIS — I2510 Atherosclerotic heart disease of native coronary artery without angina pectoris: Secondary | ICD-10-CM | POA: Diagnosis present

## 2013-07-15 DIAGNOSIS — E785 Hyperlipidemia, unspecified: Secondary | ICD-10-CM | POA: Diagnosis not present

## 2013-07-15 NOTE — Progress Notes (Addendum)
Frequent PVC's couplets noted today with exercise.  Patient asymptomatic.  Blood pressure 118/68.  Faxed exercise flow sheets to Dr Irven Shelling office for review.  Dr Einar Gip reviewed today's ECG tracings . No new orders received. Patient left without complaints. Joe Soto mentioned that he is wearing oxygen at night per Joe Soto. Oxygen saturation 99% on room air.

## 2013-07-16 NOTE — Progress Notes (Signed)
Received a call from Dr Irven Shelling office. Per Dr Einar Gip patient will not need oxygen to exercise at cardiac rehab. Joe Soto is suppose to be getting a life vest.

## 2013-07-20 ENCOUNTER — Encounter (HOSPITAL_COMMUNITY): Payer: PRIVATE HEALTH INSURANCE

## 2013-07-22 ENCOUNTER — Encounter (HOSPITAL_COMMUNITY)
Admission: RE | Admit: 2013-07-22 | Discharge: 2013-07-22 | Disposition: A | Discharge status: Home / Self Care | Payer: PRIVATE HEALTH INSURANCE | Source: Ambulatory Visit | Attending: Cardiology | Admitting: Cardiology

## 2013-07-22 DIAGNOSIS — Z5189 Encounter for other specified aftercare: Secondary | ICD-10-CM | POA: Diagnosis not present

## 2013-07-22 NOTE — Progress Notes (Signed)
Joe Soto is wearing his life vest

## 2013-07-24 ENCOUNTER — Encounter (HOSPITAL_COMMUNITY)
Admission: RE | Admit: 2013-07-24 | Discharge: 2013-07-24 | Disposition: A | Discharge status: Home / Self Care | Payer: PRIVATE HEALTH INSURANCE | Source: Ambulatory Visit | Attending: Cardiology | Admitting: Cardiology

## 2013-07-24 DIAGNOSIS — Z5189 Encounter for other specified aftercare: Secondary | ICD-10-CM | POA: Diagnosis not present

## 2013-07-27 ENCOUNTER — Encounter (HOSPITAL_COMMUNITY): Payer: PRIVATE HEALTH INSURANCE

## 2013-07-29 ENCOUNTER — Encounter (HOSPITAL_COMMUNITY)
Admission: RE | Admit: 2013-07-29 | Discharge: 2013-07-29 | Disposition: A | Discharge status: Home / Self Care | Payer: PRIVATE HEALTH INSURANCE | Source: Ambulatory Visit | Attending: Cardiology | Admitting: Cardiology

## 2013-07-29 DIAGNOSIS — Z5189 Encounter for other specified aftercare: Secondary | ICD-10-CM | POA: Diagnosis not present

## 2013-07-29 NOTE — Progress Notes (Signed)
Reviewed home exercise with pt today.  Pt plans to walk and use stationary bike at home for exercise.  Reviewed THR, pulse, RPE, sign and symptoms, NTG use, and when to call 911 or MD.  Pt encouraged to count pulse for a full minute due to frequent PVCs.  Pt voiced understanding. Alberteen Sam, MA, ACSM RCEP

## 2013-07-31 ENCOUNTER — Encounter (HOSPITAL_COMMUNITY)
Admission: RE | Admit: 2013-07-31 | Discharge: 2013-07-31 | Disposition: A | Discharge status: Home / Self Care | Payer: PRIVATE HEALTH INSURANCE | Source: Ambulatory Visit | Attending: Cardiology | Admitting: Cardiology

## 2013-07-31 DIAGNOSIS — Z5189 Encounter for other specified aftercare: Secondary | ICD-10-CM | POA: Diagnosis not present

## 2013-08-03 ENCOUNTER — Encounter (HOSPITAL_COMMUNITY): Payer: PRIVATE HEALTH INSURANCE

## 2013-08-05 ENCOUNTER — Encounter (HOSPITAL_COMMUNITY): Payer: PRIVATE HEALTH INSURANCE

## 2013-08-07 ENCOUNTER — Encounter (HOSPITAL_COMMUNITY)
Admission: RE | Admit: 2013-08-07 | Discharge: 2013-08-07 | Disposition: A | Discharge status: Home / Self Care | Payer: PRIVATE HEALTH INSURANCE | Source: Ambulatory Visit | Attending: Cardiology | Admitting: Cardiology

## 2013-08-07 DIAGNOSIS — Z5189 Encounter for other specified aftercare: Secondary | ICD-10-CM | POA: Diagnosis not present

## 2013-08-10 ENCOUNTER — Encounter (HOSPITAL_COMMUNITY): Payer: PRIVATE HEALTH INSURANCE

## 2013-08-12 ENCOUNTER — Encounter (HOSPITAL_COMMUNITY)
Admission: RE | Admit: 2013-08-12 | Discharge: 2013-08-12 | Disposition: A | Discharge status: Home / Self Care | Payer: PRIVATE HEALTH INSURANCE | Source: Ambulatory Visit | Attending: Cardiology | Admitting: Cardiology

## 2013-08-12 DIAGNOSIS — Z5189 Encounter for other specified aftercare: Secondary | ICD-10-CM | POA: Diagnosis not present

## 2013-08-14 ENCOUNTER — Encounter (HOSPITAL_COMMUNITY)
Admission: RE | Admit: 2013-08-14 | Discharge: 2013-08-14 | Disposition: A | Discharge status: Home / Self Care | Payer: PRIVATE HEALTH INSURANCE | Source: Ambulatory Visit | Attending: Cardiology | Admitting: Cardiology

## 2013-08-14 DIAGNOSIS — Z5189 Encounter for other specified aftercare: Secondary | ICD-10-CM | POA: Diagnosis not present

## 2013-08-17 ENCOUNTER — Encounter (HOSPITAL_COMMUNITY): Payer: PRIVATE HEALTH INSURANCE

## 2013-08-19 ENCOUNTER — Encounter (HOSPITAL_COMMUNITY)
Admission: RE | Admit: 2013-08-19 | Discharge: 2013-08-19 | Disposition: A | Discharge status: Home / Self Care | Payer: PRIVATE HEALTH INSURANCE | Source: Ambulatory Visit | Attending: Cardiology | Admitting: Cardiology

## 2013-08-19 DIAGNOSIS — E785 Hyperlipidemia, unspecified: Secondary | ICD-10-CM | POA: Diagnosis not present

## 2013-08-19 DIAGNOSIS — Z5189 Encounter for other specified aftercare: Secondary | ICD-10-CM | POA: Diagnosis not present

## 2013-08-19 DIAGNOSIS — Z9861 Coronary angioplasty status: Secondary | ICD-10-CM | POA: Insufficient documentation

## 2013-08-19 DIAGNOSIS — I10 Essential (primary) hypertension: Secondary | ICD-10-CM | POA: Diagnosis not present

## 2013-08-19 DIAGNOSIS — I2510 Atherosclerotic heart disease of native coronary artery without angina pectoris: Secondary | ICD-10-CM | POA: Insufficient documentation

## 2013-08-21 ENCOUNTER — Encounter (HOSPITAL_COMMUNITY)
Admission: RE | Admit: 2013-08-21 | Discharge: 2013-08-21 | Disposition: A | Discharge status: Home / Self Care | Payer: PRIVATE HEALTH INSURANCE | Source: Ambulatory Visit | Attending: Cardiology | Admitting: Cardiology

## 2013-08-21 DIAGNOSIS — Z5189 Encounter for other specified aftercare: Secondary | ICD-10-CM | POA: Diagnosis not present

## 2013-08-21 NOTE — Progress Notes (Signed)
Pt weight up 1.5 kg in 2 days at cardiac rehab.  Pt asymptomatic. Pt denies dyspnea or edema.  Lungs clear. Unable to obtain O2sat due to cold fingers.  VSS.  Pt able to exercise without difficulty.  Pt reports he ate peanut butter crackers and cocacola prior to rehab session today.  Dr. Einar Gip made aware via fax since office closed. Pt instructed to check daily home  weights and follow low sodium dietary restrictions. Understanding verbalized

## 2013-08-24 ENCOUNTER — Encounter (HOSPITAL_COMMUNITY): Payer: PRIVATE HEALTH INSURANCE

## 2013-08-26 ENCOUNTER — Encounter (HOSPITAL_COMMUNITY)
Admission: RE | Admit: 2013-08-26 | Discharge: 2013-08-26 | Disposition: A | Discharge status: Home / Self Care | Payer: PRIVATE HEALTH INSURANCE | Source: Ambulatory Visit | Attending: Cardiology | Admitting: Cardiology

## 2013-08-26 DIAGNOSIS — Z5189 Encounter for other specified aftercare: Secondary | ICD-10-CM | POA: Diagnosis not present

## 2013-08-28 ENCOUNTER — Encounter (HOSPITAL_COMMUNITY)
Admission: RE | Admit: 2013-08-28 | Discharge: 2013-08-28 | Disposition: A | Discharge status: Home / Self Care | Payer: PRIVATE HEALTH INSURANCE | Source: Ambulatory Visit | Attending: Cardiology | Admitting: Cardiology

## 2013-08-28 DIAGNOSIS — Z5189 Encounter for other specified aftercare: Secondary | ICD-10-CM | POA: Diagnosis not present

## 2013-08-31 ENCOUNTER — Encounter (HOSPITAL_COMMUNITY): Payer: PRIVATE HEALTH INSURANCE

## 2013-09-02 ENCOUNTER — Encounter (HOSPITAL_COMMUNITY)
Admission: RE | Admit: 2013-09-02 | Discharge: 2013-09-02 | Disposition: A | Discharge status: Home / Self Care | Payer: PRIVATE HEALTH INSURANCE | Source: Ambulatory Visit | Attending: Cardiology | Admitting: Cardiology

## 2013-09-02 DIAGNOSIS — Z5189 Encounter for other specified aftercare: Secondary | ICD-10-CM | POA: Diagnosis not present

## 2013-09-04 ENCOUNTER — Encounter (HOSPITAL_COMMUNITY)
Admission: RE | Admit: 2013-09-04 | Discharge: 2013-09-04 | Disposition: A | Discharge status: Home / Self Care | Payer: PRIVATE HEALTH INSURANCE | Source: Ambulatory Visit | Attending: Cardiology | Admitting: Cardiology

## 2013-09-04 DIAGNOSIS — Z5189 Encounter for other specified aftercare: Secondary | ICD-10-CM | POA: Diagnosis not present

## 2013-09-07 ENCOUNTER — Encounter (HOSPITAL_COMMUNITY): Payer: PRIVATE HEALTH INSURANCE

## 2013-09-09 ENCOUNTER — Encounter (HOSPITAL_COMMUNITY)
Admission: RE | Admit: 2013-09-09 | Discharge: 2013-09-09 | Disposition: A | Discharge status: Home / Self Care | Payer: PRIVATE HEALTH INSURANCE | Source: Ambulatory Visit | Attending: Cardiology | Admitting: Cardiology

## 2013-09-09 DIAGNOSIS — Z5189 Encounter for other specified aftercare: Secondary | ICD-10-CM | POA: Diagnosis not present

## 2013-09-11 ENCOUNTER — Encounter (HOSPITAL_COMMUNITY)
Admission: RE | Admit: 2013-09-11 | Discharge: 2013-09-11 | Disposition: A | Discharge status: Home / Self Care | Payer: PRIVATE HEALTH INSURANCE | Source: Ambulatory Visit | Attending: Cardiology | Admitting: Cardiology

## 2013-09-11 DIAGNOSIS — Z5189 Encounter for other specified aftercare: Secondary | ICD-10-CM | POA: Diagnosis not present

## 2013-09-14 ENCOUNTER — Encounter (HOSPITAL_COMMUNITY): Payer: PRIVATE HEALTH INSURANCE

## 2013-09-16 ENCOUNTER — Encounter (HOSPITAL_COMMUNITY)
Admission: RE | Admit: 2013-09-16 | Discharge: 2013-09-16 | Disposition: A | Discharge status: Home / Self Care | Payer: Medicare Other | Source: Ambulatory Visit | Attending: Cardiology | Admitting: Cardiology

## 2013-09-16 DIAGNOSIS — E785 Hyperlipidemia, unspecified: Secondary | ICD-10-CM | POA: Insufficient documentation

## 2013-09-16 DIAGNOSIS — I2510 Atherosclerotic heart disease of native coronary artery without angina pectoris: Secondary | ICD-10-CM | POA: Insufficient documentation

## 2013-09-16 DIAGNOSIS — Z5189 Encounter for other specified aftercare: Secondary | ICD-10-CM | POA: Insufficient documentation

## 2013-09-16 DIAGNOSIS — Z9861 Coronary angioplasty status: Secondary | ICD-10-CM | POA: Diagnosis not present

## 2013-09-16 DIAGNOSIS — I10 Essential (primary) hypertension: Secondary | ICD-10-CM | POA: Insufficient documentation

## 2013-09-16 NOTE — Progress Notes (Signed)
Joe Soto 80 y.o. male Nutrition Note Spoke with pt.  Nutrition Plan and Nutrition Survey goals reviewed with pt. Pt is following Step 2 of the Therapeutic Lifestyle Changes diet. Due to CHF, pt is monitoring sodium intake by choosing fresh/frozen vegetables and avoiding processed and canned food. Pt with slow, chronic wt loss according to EMR. Pt wt is down 20 lb over the past year and a half. Pt wt is down 16 lb over the past 11 months. Pt educated re: high kcal, high protein diet. Pt expressed understanding of the information reviewed. Pt aware of nutrition education classes offered and is unable to attend nutrition classes .  Nutrition Diagnosis   Food-and nutrition-related knowledge deficit related to lack of exposure to information as related to diagnosis of: ? CVD   Nutrition Intervention   Pt's individual nutrition plan reviewed with pt.   Benefits of adopting Therapeutic Lifestyle Changes discussed when Medficts reviewed.   Pt to attend the Portion Distortion class   Pt given handouts for: ? Nutrition I class ? Nutrition II class ? High Calorie, High Protein diet and recipes   Continue client-centered nutrition education by RD, as part of interdisciplinary care. Goal(s)   Pt to identify food quantities necessary to achieve: ? wt gain to a goal wt of 150-160 lb (68.2-72.7 kg) at graduation from cardiac rehab.    Pt to describe the benefit of including fruits, vegetables, whole grains, and low-fat dairy products in a heart healthy meal plan. Monitor and Evaluate progress toward nutrition goal with team. Nutrition Risk:  Change to Moderate Derek Mound, M.Ed, RD, LDN, CDE 09/16/2013 4:04 PM

## 2013-09-18 ENCOUNTER — Encounter (HOSPITAL_COMMUNITY)
Admission: RE | Admit: 2013-09-18 | Discharge: 2013-09-18 | Disposition: A | Discharge status: Home / Self Care | Payer: Medicare Other | Source: Ambulatory Visit | Attending: Cardiology | Admitting: Cardiology

## 2013-09-18 DIAGNOSIS — Z5189 Encounter for other specified aftercare: Secondary | ICD-10-CM | POA: Diagnosis not present

## 2013-09-22 ENCOUNTER — Ambulatory Visit
Admission: RE | Admit: 2013-09-22 | Discharge: 2013-09-22 | Disposition: A | Discharge status: Home / Self Care | Payer: Medicare Other | Source: Ambulatory Visit | Attending: Urology | Admitting: Urology

## 2013-09-22 DIAGNOSIS — M810 Age-related osteoporosis without current pathological fracture: Secondary | ICD-10-CM

## 2013-09-23 ENCOUNTER — Encounter (HOSPITAL_COMMUNITY): Admission: RE | Admit: 2013-09-23 | Payer: Medicare Other | Source: Ambulatory Visit

## 2013-09-23 ENCOUNTER — Telehealth (HOSPITAL_COMMUNITY): Payer: Self-pay | Admitting: *Deleted

## 2013-09-25 ENCOUNTER — Encounter (HOSPITAL_COMMUNITY): Admission: RE | Admit: 2013-09-25 | Payer: Medicare Other | Source: Ambulatory Visit

## 2013-09-28 ENCOUNTER — Encounter (HOSPITAL_COMMUNITY): Payer: Medicare Other

## 2013-09-30 ENCOUNTER — Telehealth (HOSPITAL_COMMUNITY): Payer: Self-pay | Admitting: Endocrinology

## 2013-09-30 ENCOUNTER — Encounter (HOSPITAL_COMMUNITY): Payer: Medicare Other

## 2013-10-02 ENCOUNTER — Encounter (HOSPITAL_COMMUNITY)
Admission: RE | Admit: 2013-10-02 | Discharge: 2013-10-02 | Disposition: A | Discharge status: Home / Self Care | Payer: Medicare Other | Source: Ambulatory Visit | Attending: Cardiology | Admitting: Cardiology

## 2013-10-02 DIAGNOSIS — Z5189 Encounter for other specified aftercare: Secondary | ICD-10-CM | POA: Diagnosis not present

## 2013-10-05 ENCOUNTER — Encounter (HOSPITAL_COMMUNITY): Payer: Medicare Other

## 2013-10-07 ENCOUNTER — Encounter (HOSPITAL_COMMUNITY)
Admission: RE | Admit: 2013-10-07 | Discharge: 2013-10-07 | Disposition: A | Discharge status: Home / Self Care | Payer: Medicare Other | Source: Ambulatory Visit | Attending: Cardiology | Admitting: Cardiology

## 2013-10-07 DIAGNOSIS — Z5189 Encounter for other specified aftercare: Secondary | ICD-10-CM | POA: Diagnosis not present

## 2013-10-09 ENCOUNTER — Encounter (HOSPITAL_COMMUNITY)
Admission: RE | Admit: 2013-10-09 | Discharge: 2013-10-09 | Disposition: A | Discharge status: Home / Self Care | Payer: Medicare Other | Source: Ambulatory Visit | Attending: Cardiology | Admitting: Cardiology

## 2013-10-09 DIAGNOSIS — Z5189 Encounter for other specified aftercare: Secondary | ICD-10-CM | POA: Diagnosis not present

## 2013-10-12 ENCOUNTER — Encounter (HOSPITAL_COMMUNITY): Payer: Medicare Other

## 2013-10-14 ENCOUNTER — Encounter (HOSPITAL_COMMUNITY)
Admission: RE | Admit: 2013-10-14 | Discharge: 2013-10-14 | Disposition: A | Discharge status: Home / Self Care | Payer: Medicare Other | Source: Ambulatory Visit | Attending: Cardiology | Admitting: Cardiology

## 2013-10-14 DIAGNOSIS — Z5189 Encounter for other specified aftercare: Secondary | ICD-10-CM | POA: Diagnosis not present

## 2013-10-16 ENCOUNTER — Encounter (HOSPITAL_COMMUNITY)
Admission: RE | Admit: 2013-10-16 | Discharge: 2013-10-16 | Disposition: A | Discharge status: Home / Self Care | Payer: Medicare Other | Source: Ambulatory Visit | Attending: Cardiology | Admitting: Cardiology

## 2013-10-16 ENCOUNTER — Encounter (HOSPITAL_COMMUNITY): Admission: RE | Admit: 2013-10-16 | Payer: Medicare Other | Source: Ambulatory Visit

## 2013-10-16 DIAGNOSIS — Z9861 Coronary angioplasty status: Secondary | ICD-10-CM | POA: Insufficient documentation

## 2013-10-16 DIAGNOSIS — I2510 Atherosclerotic heart disease of native coronary artery without angina pectoris: Secondary | ICD-10-CM | POA: Diagnosis not present

## 2013-10-16 DIAGNOSIS — E785 Hyperlipidemia, unspecified: Secondary | ICD-10-CM | POA: Diagnosis not present

## 2013-10-16 DIAGNOSIS — I10 Essential (primary) hypertension: Secondary | ICD-10-CM | POA: Insufficient documentation

## 2013-10-16 DIAGNOSIS — Z5189 Encounter for other specified aftercare: Secondary | ICD-10-CM | POA: Insufficient documentation

## 2013-10-19 ENCOUNTER — Encounter (HOSPITAL_COMMUNITY): Payer: Medicare Other

## 2013-10-21 ENCOUNTER — Encounter (HOSPITAL_COMMUNITY)
Admission: RE | Admit: 2013-10-21 | Discharge: 2013-10-21 | Disposition: A | Discharge status: Home / Self Care | Payer: Medicare Other | Source: Ambulatory Visit | Attending: Cardiology | Admitting: Cardiology

## 2013-10-21 DIAGNOSIS — Z5189 Encounter for other specified aftercare: Secondary | ICD-10-CM | POA: Diagnosis not present

## 2013-10-23 ENCOUNTER — Encounter (HOSPITAL_COMMUNITY)
Admission: RE | Admit: 2013-10-23 | Discharge: 2013-10-23 | Disposition: A | Discharge status: Home / Self Care | Payer: Medicare Other | Source: Ambulatory Visit | Attending: Cardiology | Admitting: Cardiology

## 2013-10-23 DIAGNOSIS — Z5189 Encounter for other specified aftercare: Secondary | ICD-10-CM | POA: Diagnosis not present

## 2013-10-26 ENCOUNTER — Encounter (HOSPITAL_COMMUNITY): Payer: Medicare Other

## 2013-10-28 ENCOUNTER — Encounter (HOSPITAL_COMMUNITY)
Admission: RE | Admit: 2013-10-28 | Discharge: 2013-10-28 | Disposition: A | Discharge status: Home / Self Care | Payer: Medicare Other | Source: Ambulatory Visit | Attending: Cardiology | Admitting: Cardiology

## 2013-10-28 DIAGNOSIS — Z5189 Encounter for other specified aftercare: Secondary | ICD-10-CM | POA: Diagnosis not present

## 2013-10-29 ENCOUNTER — Encounter: Payer: Self-pay | Admitting: Pulmonary Disease

## 2013-10-29 ENCOUNTER — Ambulatory Visit (INDEPENDENT_AMBULATORY_CARE_PROVIDER_SITE_OTHER): Payer: Medicare Other | Admitting: Pulmonary Disease

## 2013-10-29 ENCOUNTER — Encounter (INDEPENDENT_AMBULATORY_CARE_PROVIDER_SITE_OTHER): Payer: Self-pay

## 2013-10-29 VITALS — BP 120/68 | HR 71 | Temp 97.1°F | Ht 71.0 in | Wt 144.6 lb

## 2013-10-29 DIAGNOSIS — Z23 Encounter for immunization: Secondary | ICD-10-CM

## 2013-10-29 DIAGNOSIS — J449 Chronic obstructive pulmonary disease, unspecified: Secondary | ICD-10-CM

## 2013-10-29 DIAGNOSIS — J309 Allergic rhinitis, unspecified: Secondary | ICD-10-CM

## 2013-10-29 NOTE — Assessment & Plan Note (Signed)
The patient is doing very well on his current asthma regimen. I have asked him to make sure he does not miss doses of his maintenance inhaler.

## 2013-10-29 NOTE — Progress Notes (Signed)
   Subjective:    Patient ID: Joe Soto, male    DOB: 28-Aug-1923, 80 y.o.   MRN: [id number]  HPI The patient comes in today for followup of his known chronic obstructive asthma and allergic rhinitis. He has had percutaneous intervention with stenting since his last visit, and has been doing well. He has had no issues with his asthma since last visit, and rarely uses his rescue inhaler. He is having increased allergic rhinitis symptoms, expected during fall allergy season. In the past he has responded well to aspirin nasal spray.   Review of Systems  Constitutional: Negative for fever and unexpected weight change.  HENT: Positive for congestion and postnasal drip. Negative for dental problem, ear pain, nosebleeds, rhinorrhea, sinus pressure, sneezing, sore throat and trouble swallowing.   Eyes: Negative for redness and itching.  Respiratory: Positive for cough and shortness of breath. Negative for chest tightness and wheezing.   Cardiovascular: Negative for palpitations and leg swelling.  Gastrointestinal: Negative for nausea and vomiting.  Genitourinary: Negative for dysuria.  Musculoskeletal: Negative for joint swelling.  Skin: Negative for rash.  Neurological: Negative for headaches.  Hematological: Does not bruise/bleed easily.  Psychiatric/Behavioral: Negative for dysphoric mood. The patient is not nervous/anxious.        Objective:   Physical Exam Thin male in no acute distress Nose without purulence or discharge noted Neck without lymphadenopathy or thyromegaly Chest with totally clear breath sounds, no wheezing Heart exam with regular rate and rhythm Lower extremities without edema, no cyanosis Alert and oriented, moves all 4 extremities.       Assessment & Plan:

## 2013-10-29 NOTE — Patient Instructions (Signed)
Stay on your advair 2 inhalations am AND pm Will get you a prescription for astepro nasal spray.  Can take 2 sprays each nostril each am, but can take the same dose in the evening if needed.  Will give you the flu shot today. followup with me again in 12mos

## 2013-10-29 NOTE — Assessment & Plan Note (Signed)
The patient has been having increased symptoms, not unexpected given the upcoming fall allergy season. He has responded well to asked to astepro in the past, and will start him on this again.

## 2013-10-30 ENCOUNTER — Encounter (HOSPITAL_COMMUNITY)
Admission: RE | Admit: 2013-10-30 | Discharge: 2013-10-30 | Disposition: A | Discharge status: Home / Self Care | Payer: Medicare Other | Source: Ambulatory Visit | Attending: Cardiology | Admitting: Cardiology

## 2013-10-30 DIAGNOSIS — Z5189 Encounter for other specified aftercare: Secondary | ICD-10-CM | POA: Diagnosis not present

## 2013-11-02 ENCOUNTER — Encounter (HOSPITAL_COMMUNITY): Payer: Medicare Other

## 2013-11-04 ENCOUNTER — Encounter (HOSPITAL_COMMUNITY)
Admission: RE | Admit: 2013-11-04 | Discharge: 2013-11-04 | Disposition: A | Discharge status: Home / Self Care | Payer: Medicare Other | Source: Ambulatory Visit | Attending: Cardiology | Admitting: Cardiology

## 2013-11-04 DIAGNOSIS — Z5189 Encounter for other specified aftercare: Secondary | ICD-10-CM | POA: Diagnosis not present

## 2013-11-06 ENCOUNTER — Telehealth: Payer: Self-pay | Admitting: Pulmonary Disease

## 2013-11-06 ENCOUNTER — Encounter (HOSPITAL_COMMUNITY)
Admission: RE | Admit: 2013-11-06 | Discharge: 2013-11-06 | Disposition: A | Discharge status: Home / Self Care | Payer: Medicare Other | Source: Ambulatory Visit | Attending: Cardiology | Admitting: Cardiology

## 2013-11-06 DIAGNOSIS — Z5189 Encounter for other specified aftercare: Secondary | ICD-10-CM | POA: Diagnosis not present

## 2013-11-06 MED ORDER — FLUTICASONE-SALMETEROL 230-21 MCG/ACT IN AERO: 2.0000 | INHALATION_SPRAY | Freq: Two times a day (BID) | RESPIRATORY_TRACT | Status: DC

## 2013-11-06 MED ORDER — AZELASTINE HCL 0.15 % NA SOLN: 2.0000 | Freq: Every day | NASAL | Status: DC

## 2013-11-06 NOTE — Progress Notes (Signed)
[employer] graduates today. Joya San says his goals have been met he has more energy. Thurman's PHQ score=0. Thurman plans to continue exercise on his own with his home equipment.

## 2013-11-06 NOTE — Telephone Encounter (Signed)
[email or web address].Jarious Lyon, CMA  

## 2013-11-06 NOTE — Telephone Encounter (Signed)
Refills sent of astepro and advair. Pt aware. [place] Bing, CMA

## 2013-11-07 ENCOUNTER — Emergency Department (HOSPITAL_COMMUNITY)
Admission: EM | Admit: 2013-11-07 | Discharge: 2013-11-08 | Disposition: A | Discharge status: Home / Self Care | Payer: Medicare Other | Attending: Emergency Medicine | Admitting: Emergency Medicine

## 2013-11-07 ENCOUNTER — Emergency Department (HOSPITAL_COMMUNITY): Payer: Medicare Other

## 2013-11-07 ENCOUNTER — Encounter (HOSPITAL_COMMUNITY): Payer: Self-pay | Admitting: Emergency Medicine

## 2013-11-07 DIAGNOSIS — S80212A Abrasion, left knee, initial encounter: Secondary | ICD-10-CM | POA: Insufficient documentation

## 2013-11-07 DIAGNOSIS — E785 Hyperlipidemia, unspecified: Secondary | ICD-10-CM | POA: Diagnosis not present

## 2013-11-07 DIAGNOSIS — Z79899 Other long term (current) drug therapy: Secondary | ICD-10-CM | POA: Diagnosis not present

## 2013-11-07 DIAGNOSIS — S5011XA Contusion of right forearm, initial encounter: Secondary | ICD-10-CM | POA: Diagnosis not present

## 2013-11-07 DIAGNOSIS — M13839 Other specified arthritis, unspecified wrist: Secondary | ICD-10-CM | POA: Diagnosis not present

## 2013-11-07 DIAGNOSIS — E039 Hypothyroidism, unspecified: Secondary | ICD-10-CM | POA: Diagnosis not present

## 2013-11-07 DIAGNOSIS — I2510 Atherosclerotic heart disease of native coronary artery without angina pectoris: Secondary | ICD-10-CM | POA: Diagnosis not present

## 2013-11-07 DIAGNOSIS — I252 Old myocardial infarction: Secondary | ICD-10-CM | POA: Diagnosis not present

## 2013-11-07 DIAGNOSIS — Y9389 Activity, other specified: Secondary | ICD-10-CM | POA: Insufficient documentation

## 2013-11-07 DIAGNOSIS — J45909 Unspecified asthma, uncomplicated: Secondary | ICD-10-CM | POA: Insufficient documentation

## 2013-11-07 DIAGNOSIS — Y9289 Other specified places as the place of occurrence of the external cause: Secondary | ICD-10-CM | POA: Diagnosis not present

## 2013-11-07 DIAGNOSIS — Z8719 Personal history of other diseases of the digestive system: Secondary | ICD-10-CM | POA: Insufficient documentation

## 2013-11-07 DIAGNOSIS — Z85828 Personal history of other malignant neoplasm of skin: Secondary | ICD-10-CM | POA: Diagnosis not present

## 2013-11-07 DIAGNOSIS — Z7982 Long term (current) use of aspirin: Secondary | ICD-10-CM | POA: Insufficient documentation

## 2013-11-07 DIAGNOSIS — I10 Essential (primary) hypertension: Secondary | ICD-10-CM | POA: Insufficient documentation

## 2013-11-07 DIAGNOSIS — S0993XA Unspecified injury of face, initial encounter: Secondary | ICD-10-CM | POA: Diagnosis present

## 2013-11-07 DIAGNOSIS — Z7902 Long term (current) use of antithrombotics/antiplatelets: Secondary | ICD-10-CM | POA: Diagnosis not present

## 2013-11-07 DIAGNOSIS — S80211A Abrasion, right knee, initial encounter: Secondary | ICD-10-CM | POA: Diagnosis not present

## 2013-11-07 DIAGNOSIS — S60511A Abrasion of right hand, initial encounter: Secondary | ICD-10-CM | POA: Insufficient documentation

## 2013-11-07 DIAGNOSIS — Z23 Encounter for immunization: Secondary | ICD-10-CM | POA: Insufficient documentation

## 2013-11-07 DIAGNOSIS — S60512A Abrasion of left hand, initial encounter: Secondary | ICD-10-CM | POA: Diagnosis not present

## 2013-11-07 DIAGNOSIS — Z87442 Personal history of urinary calculi: Secondary | ICD-10-CM | POA: Insufficient documentation

## 2013-11-07 DIAGNOSIS — S0083XA Contusion of other part of head, initial encounter: Secondary | ICD-10-CM | POA: Insufficient documentation

## 2013-11-07 DIAGNOSIS — Z9861 Coronary angioplasty status: Secondary | ICD-10-CM | POA: Insufficient documentation

## 2013-11-07 DIAGNOSIS — Z8673 Personal history of transient ischemic attack (TIA), and cerebral infarction without residual deficits: Secondary | ICD-10-CM | POA: Diagnosis not present

## 2013-11-07 DIAGNOSIS — W010XXA Fall on same level from slipping, tripping and stumbling without subsequent striking against object, initial encounter: Secondary | ICD-10-CM | POA: Insufficient documentation

## 2013-11-07 DIAGNOSIS — Z7951 Long term (current) use of inhaled steroids: Secondary | ICD-10-CM | POA: Insufficient documentation

## 2013-11-07 DIAGNOSIS — W19XXXA Unspecified fall, initial encounter: Secondary | ICD-10-CM

## 2013-11-07 MED ORDER — TETANUS-DIPHTH-ACELL PERTUSSIS 5-2.5-18.5 LF-MCG/0.5 IM SUSP
0.5000 mL | Freq: Once | INTRAMUSCULAR | Status: AC
  Administered 2013-11-07: 0.5 mL via INTRAMUSCULAR
  Filled 2013-11-07: qty 0.5

## 2013-11-07 NOTE — ED Notes (Signed)
MD at bedside. 
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
## 2013-11-07 NOTE — ED Notes (Signed)
Pt reports tripped and fell tonight.  Abrasions noted on his forehead, bila hands and R knee.  Pt denies LOC.

## 2013-11-08 LAB — URINALYSIS, ROUTINE W REFLEX MICROSCOPIC
Glucose, UA: NEGATIVE mg/dL
Hgb urine dipstick: NEGATIVE
Ketones, ur: NEGATIVE mg/dL
Leukocytes, UA: NEGATIVE
NITRITE: NEGATIVE
Protein, ur: NEGATIVE mg/dL
SPECIFIC GRAVITY, URINE: 1.023 (ref 1.005–1.030)
UROBILINOGEN UA: 1 mg/dL (ref 0.0–1.0)
pH: 5 (ref 5.0–8.0)

## 2013-11-08 NOTE — Discharge Instructions (Signed)
Fall Prevention and Home Safety Joe Soto, you were seen today after a fall.  Your CT scan and xrays were negative for fracture or dislocation.  Follow up with your regular doctor within 3 days for continued treatment.  Apply normal soap and water to your wounds every day until they heal.  Take tylenol at home as needed for pain.  If any symptoms worsen, come back to the ED for repeat evaluation.  Thank you. Falls cause injuries and can affect all age groups. It is possible to prevent falls.  HOW TO PREVENT FALLS  Wear shoes with rubber soles that do not have an opening for your toes.  Keep the inside and outside of your house well lit.  Use night lights throughout your home.  Remove clutter from floors.  Clean up floor spills.  Remove throw rugs or fasten them to the floor with carpet tape.  Do not place electrical cords across pathways.  Put grab bars by your tub, shower, and toilet. Do not use towel bars as grab bars.  Put handrails on both sides of the stairway. Fix loose handrails.  Do not climb on stools or stepladders, if possible.  Do not wax your floors.  Repair uneven or unsafe sidewalks, walkways, or stairs.  Keep items you use a lot within reach.  Be aware of pets.  Keep emergency numbers next to the telephone.  Put smoke detectors in your home and near bedrooms. Ask your doctor what other things you can do to prevent falls. Document Released: 10/28/2008 Document Revised: 07/03/2011 Document Reviewed: 04/03/2011 [facility] Patient Information 2015 [place], [place]. This information is not intended to replace advice given to you by your health care provider. Make sure you discuss any questions you have with your health care provider.

## 2013-11-09 LAB — URINE CULTURE
COLONY COUNT: NO GROWTH
Culture: NO GROWTH

## 2013-12-24 ENCOUNTER — Encounter (HOSPITAL_COMMUNITY): Payer: Self-pay | Admitting: Cardiology

## 2014-02-17 ENCOUNTER — Other Ambulatory Visit: Payer: Self-pay | Admitting: Urology

## 2014-02-17 DIAGNOSIS — C61 Malignant neoplasm of prostate: Secondary | ICD-10-CM

## 2014-03-03 ENCOUNTER — Encounter (HOSPITAL_COMMUNITY): Payer: Self-pay

## 2014-03-08 ENCOUNTER — Encounter (HOSPITAL_COMMUNITY): Payer: Medicare Other

## 2014-03-08 ENCOUNTER — Encounter (HOSPITAL_COMMUNITY): Admission: RE | Admit: 2014-03-08 | Payer: Medicare Other | Source: Ambulatory Visit

## 2014-03-16 ENCOUNTER — Encounter (HOSPITAL_COMMUNITY)
Admission: RE | Admit: 2014-03-16 | Discharge: 2014-03-16 | Disposition: A | Discharge status: Home / Self Care | Payer: Medicare Other | Source: Ambulatory Visit | Attending: Urology | Admitting: Urology

## 2014-03-16 DIAGNOSIS — C61 Malignant neoplasm of prostate: Secondary | ICD-10-CM

## 2014-03-16 MED ORDER — TECHNETIUM TC 99M MEDRONATE IV KIT
26.7000 | PACK | Freq: Once | INTRAVENOUS | Status: AC | PRN
  Administered 2014-03-16: 26.7 via INTRAVENOUS

## 2014-04-29 ENCOUNTER — Ambulatory Visit (INDEPENDENT_AMBULATORY_CARE_PROVIDER_SITE_OTHER): Payer: Medicare Other | Admitting: Pulmonary Disease

## 2014-04-29 ENCOUNTER — Encounter: Payer: Self-pay | Admitting: Pulmonary Disease

## 2014-04-29 VITALS — BP 120/58 | HR 50 | Temp 97.7°F | Ht 71.0 in | Wt 146.6 lb

## 2014-04-29 DIAGNOSIS — J449 Chronic obstructive pulmonary disease, unspecified: Secondary | ICD-10-CM | POA: Diagnosis not present

## 2014-04-29 DIAGNOSIS — J3089 Other allergic rhinitis: Secondary | ICD-10-CM | POA: Diagnosis not present

## 2014-04-29 NOTE — Assessment & Plan Note (Signed)
The patient continues to have significant rhinorrhea, although the Astepro has helped. It raises the question whether he may have a combination of allergic and vasomotor rhinitis? I would like to try him on Dymista in the place of his Astepro, then add an antihistamine orally if he continues to have issues. If this does not work, then I would consider treatment of vasomotor rhinitis with Atrovent nasal spray.

## 2014-04-29 NOTE — Patient Instructions (Signed)
No change in your asthma medications Will try on dymista 2 sprays each nostril in the place of astepro for the next few weeks.  Can take 2 sprays in evening also if having a hard time.  If this does not help, would add an antihistamine like zyrtec or allegra at bedtime to the nasal spray.  If this continues to bother you, it may be nonallergic rhinitis and would try atrovent nasal spray.  Let us know of your progress. followup with Dr. Halford Chessman in 64mos.

## 2014-04-29 NOTE — Progress Notes (Signed)
   Subjective:    Patient ID: Joe Soto, male    DOB: 1923-11-04, 81 y.o.   MRN: [id number]  HPI The patient comes in today for follow-up of his known asthma and assumed allergic rhinitis. He has done extremely well from a breathing standpoint, and has not required his rescue inhaler. He continues to have rhinorrhea, although the Astepro has helped. The question has been raised whether we may actually be dealing with a combination of allergic and nonallergic rhinitis.   Review of Systems  Constitutional: Positive for unexpected weight change. Negative for fever.  HENT: Positive for rhinorrhea. Negative for congestion, dental problem, ear pain, nosebleeds, postnasal drip, sinus pressure, sneezing, sore throat and trouble swallowing.   Eyes: Negative for redness and itching.  Respiratory: Positive for shortness of breath. Negative for cough, chest tightness and wheezing.   Cardiovascular: Negative for palpitations and leg swelling.  Gastrointestinal: Negative for nausea and vomiting.  Genitourinary: Negative for dysuria.  Musculoskeletal: Negative for joint swelling.  Skin: Negative for rash.  Neurological: Negative for headaches.  Hematological: Does not bruise/bleed easily.  Psychiatric/Behavioral: Negative for dysphoric mood. The patient is not nervous/anxious.        Objective:   Physical Exam  Thin male in no acute distress Nose without purulence or discharge noted Neck without lymphadenopathy or thyromegaly Chest totally clear to auscultation, no wheezing Cardiac exam with regular rate and rhythm Lower extremities without edema, no cyanosis Alert and oriented, moves all 4 extremities.      Assessment & Plan:

## 2014-04-29 NOTE — Assessment & Plan Note (Signed)
The patient continues to do extremely well on Advair, and has not had an acute exacerbation or need for his rescue inhaler.

## 2014-05-20 ENCOUNTER — Institutional Professional Consult (permissible substitution): Payer: Medicare Other | Admitting: Neurology

## 2014-05-26 ENCOUNTER — Ambulatory Visit (INDEPENDENT_AMBULATORY_CARE_PROVIDER_SITE_OTHER): Payer: Medicare Other | Admitting: Neurology

## 2014-05-26 ENCOUNTER — Encounter: Payer: Self-pay | Admitting: Neurology

## 2014-05-26 VITALS — BP 170/50 | HR 70 | Resp 16 | Ht 71.0 in | Wt 143.0 lb

## 2014-05-26 DIAGNOSIS — R0683 Snoring: Secondary | ICD-10-CM | POA: Diagnosis not present

## 2014-05-26 DIAGNOSIS — G4734 Idiopathic sleep related nonobstructive alveolar hypoventilation: Secondary | ICD-10-CM

## 2014-05-26 DIAGNOSIS — G2581 Restless legs syndrome: Secondary | ICD-10-CM

## 2014-05-26 DIAGNOSIS — I2510 Atherosclerotic heart disease of native coronary artery without angina pectoris: Secondary | ICD-10-CM | POA: Diagnosis not present

## 2014-05-26 DIAGNOSIS — G4719 Other hypersomnia: Secondary | ICD-10-CM
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
## 2014-05-26 NOTE — Patient Instructions (Addendum)

## 2014-06-17 ENCOUNTER — Ambulatory Visit (INDEPENDENT_AMBULATORY_CARE_PROVIDER_SITE_OTHER): Payer: Medicare Other | Admitting: Neurology

## 2014-06-17 VITALS — BP 138/48 | HR 61

## 2014-06-17 DIAGNOSIS — G479 Sleep disorder, unspecified: Secondary | ICD-10-CM

## 2014-06-17 DIAGNOSIS — G4761 Periodic limb movement disorder: Secondary | ICD-10-CM

## 2014-06-17 DIAGNOSIS — R9431 Abnormal electrocardiogram [ECG] [EKG]: Secondary | ICD-10-CM

## 2014-06-17 DIAGNOSIS — G478 Other sleep disorders: Secondary | ICD-10-CM

## 2014-06-17 DIAGNOSIS — G4720 Circadian rhythm sleep disorder, unspecified type: Secondary | ICD-10-CM

## 2014-06-18 NOTE — Sleep Study (Signed)
Please see the scanned sleep study interpretation located in the Procedure tab within the Chart Review section. 

## 2014-06-23 ENCOUNTER — Telehealth: Payer: Self-pay | Admitting: Neurology

## 2014-06-23 NOTE — Telephone Encounter (Signed)
Please call and notify the patient that the recent sleep study did not show any significant obstructive sleep apnea. However, there were a lot of leg twitches, which we call periodic leg movements and this disrupted his sleep quite a bit. Please inform patient that I would like to go over the details of the study and potential treatment options during a follow up appointment, arrange a followup appointment (please utilize a followu-up slot). Also, route or fax report to PCP and referring MD, if other than PCP.  Once you have spoken to patient, you can close this encounter.   Thanks,  Star Age, MD, PhD [facility])

## 2014-06-24 NOTE — Telephone Encounter (Signed)
I spoke to patient briefly. He asked if he can call me back tomorrow for results, he was on his way out of the house.

## 2014-06-25 NOTE — Telephone Encounter (Signed)
I faxed report to Dr. Einar Gip. i spoke to patient and he is aware of results. He made f/u appt for Monday to discuss results with Dr.Athar

## 2014-06-28 ENCOUNTER — Ambulatory Visit: Payer: Self-pay | Admitting: Neurology

## 2014-07-01 ENCOUNTER — Encounter: Payer: Self-pay | Admitting: Neurology

## 2014-07-06 ENCOUNTER — Telehealth: Payer: Self-pay | Admitting: Neurology

## 2014-07-06 NOTE — Telephone Encounter (Signed)
Patient called stating he would like to get results for sleep study. He states he was unaware of the appt he missed. Please call and advise. Patient cn be reached [phone].

## 2014-07-07 NOTE — Telephone Encounter (Signed)
I spoke to patient and gave him sleep study results (which were given to him on 6/10). He states that he understands. I made an appointment for him this Friday to discuss results.

## 2014-07-09 ENCOUNTER — Encounter: Payer: Self-pay | Admitting: Neurology

## 2014-07-09 ENCOUNTER — Ambulatory Visit (INDEPENDENT_AMBULATORY_CARE_PROVIDER_SITE_OTHER): Payer: Medicare Other | Admitting: Neurology

## 2014-07-09 VITALS — BP 140/52 | HR 78 | Resp 16 | Ht 71.0 in | Wt 141.0 lb

## 2014-07-09 DIAGNOSIS — G479 Sleep disorder, unspecified: Secondary | ICD-10-CM

## 2014-07-09 DIAGNOSIS — G4719 Other hypersomnia: Secondary | ICD-10-CM

## 2014-07-09 DIAGNOSIS — G2581 Restless legs syndrome: Secondary | ICD-10-CM

## 2014-07-09 DIAGNOSIS — I2510 Atherosclerotic heart disease of native coronary artery without angina pectoris: Secondary | ICD-10-CM | POA: Diagnosis not present

## 2014-07-09 DIAGNOSIS — G4761 Periodic limb movement disorder: Secondary | ICD-10-CM

## 2014-07-09 MED ORDER — GABAPENTIN 100 MG PO CAPS: ORAL_CAPSULE | ORAL | Status: DC

## 2014-07-09 NOTE — Patient Instructions (Signed)
You have no significant sleep apnea.  You did not sleep very well, and you legs were twitching all night long, which is disruptive to your sleep.  Let's try Neurontin (gabapentin) 100 mg strength: Take 1 pill nightly at bedtime for 1 week, then 2 pills nightly for 1 week, then 3 pills each night thereafter. The most common side effects reported are sedation or sleepiness. Rare side effects include balance problems, or confusion. However, we going to use a very small dose only.
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
## 2014-10-14 ENCOUNTER — Ambulatory Visit (INDEPENDENT_AMBULATORY_CARE_PROVIDER_SITE_OTHER): Payer: Medicare Other | Admitting: Neurology

## 2014-10-14 ENCOUNTER — Encounter: Payer: Self-pay | Admitting: Neurology

## 2014-10-14 VITALS — BP 128/62 | HR 78 | Resp 16 | Ht 71.0 in | Wt 139.0 lb

## 2014-10-14 DIAGNOSIS — G4761 Periodic limb movement disorder: Secondary | ICD-10-CM | POA: Diagnosis not present

## 2014-10-14 DIAGNOSIS — G479 Sleep disorder, unspecified: Secondary | ICD-10-CM

## 2014-10-14 NOTE — Patient Instructions (Signed)
I am glad you are doing well.  Should your leg pain at night come back, try Neurontin (gabapentin) 100 mg strength: Take 1 pill nightly at bedtime. I can give you a refill if needed. Remember, the most common side effects reported are sedation or sleepiness. Rare side effects include balance problems, confusion.
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
## 2014-10-28 ENCOUNTER — Encounter: Payer: Self-pay | Admitting: Pulmonary Disease

## 2014-10-28 ENCOUNTER — Ambulatory Visit (INDEPENDENT_AMBULATORY_CARE_PROVIDER_SITE_OTHER): Payer: Medicare Other | Admitting: Pulmonary Disease

## 2014-10-28 VITALS — BP 114/58 | HR 62 | Temp 97.0°F | Ht 71.0 in | Wt 142.8 lb

## 2014-10-28 DIAGNOSIS — J45909 Unspecified asthma, uncomplicated: Secondary | ICD-10-CM | POA: Diagnosis not present

## 2014-10-28 DIAGNOSIS — Z23 Encounter for immunization: Secondary | ICD-10-CM | POA: Diagnosis not present

## 2014-10-28 DIAGNOSIS — J449 Chronic obstructive pulmonary disease, unspecified: Secondary | ICD-10-CM | POA: Diagnosis not present

## 2014-10-28 DIAGNOSIS — J309 Allergic rhinitis, unspecified: Secondary | ICD-10-CM | POA: Diagnosis not present

## 2014-10-28 MED ORDER — FLUTICASONE PROPIONATE 50 MCG/ACT NA SUSP: 2.0000 | Freq: Every day | NASAL | Status: DC

## 2014-10-28 MED ORDER — FLUTICASONE-SALMETEROL 230-21 MCG/ACT IN AERO: 2.0000 | INHALATION_SPRAY | Freq: Two times a day (BID) | RESPIRATORY_TRACT | Status: DC

## 2014-10-28 NOTE — Patient Instructions (Signed)
Continue azelastine 2 sprays each nostril daily  Flonase 2 sprays each nostril daily  Advair two puffs twice per day as needed when asthma symptoms get worse  Flu shot today  Follow up in 6 months

## 2014-10-28 NOTE — Progress Notes (Signed)
Chief Complaint  Patient presents with  . Follow-up    Former [place] pt following for Chronic obstructive asthma. pt states he is doing very well. pt states very rarely does he have an increase in in SOB.  pt states he would feel more comfortable  if he did have an advair inhailer on hand. pt using O2 qhs unsure of flow rate. DME: [facility]     History of Present Illness: Joe Soto is a 81 y.o. male former smoker with COPD/asthma and allergic rhinitis.  He was previously followed by Dr. Gwenette Greet.  His breathing has been okay.  He is not having cough, wheeze, or chest congestion.  He has not needed to use advair recently.  His is having sinus congestion, rhinitis, and post-nasal drip.  This is triggering a cough.  He has been using azelastine >> this helps some.   TESTS: PFT 07/16/07 >> FEV1 1.76 (66%), FEV1% 40, TLC 8.30 (126%), DLCO 96%, + BD   PMhx >> HTN, CAD, HLD, CVA, GERD, Prostate cancer, Hypothyroidism  Past surgical hx, Medications, Allergies, Family hx, Social hx all reviewed.   Physical Exam: BP 114/58 mmHg  Pulse 62  Temp(Src) 97 F (36.1 C) (Oral)  Ht 5\' 11"  (1.803 m)  Wt 142 lb 12.8 oz (64.774 kg)  BMI 19.93 kg/m2  SpO2 93%  General - No distress, thin ENT - No sinus tenderness, no oral exudate, no LAN, clear nasal drainage Cardiac - s1s2 regular, no murmur Chest - No wheeze/rales/dullness Back - No focal tenderness Abd - Soft, non-tender Ext - No edema Neuro - Normal strength Skin - No rashes Psych - normal mood, and behavior   Assessment/Plan:  Allergic rhinitis. Plan: - add flonase - continue astepro  COPD with asthma. Plan: - he uses advair as needed when allergy symptoms trigger his asthma - flu shot today   Chesley Mires, MD [place]/Critical Care/Sleep Pager:  [phone]

## 2014-12-08 ENCOUNTER — Telehealth: Payer: Self-pay | Admitting: Pulmonary Disease

## 2014-12-08 NOTE — Telephone Encounter (Signed)
Pt aware that the Rx sent at Oct 2016 with VS for Advair was sent with 12 refills. Pt to notify pharmacy for refill and to contact our office back if any issues. Nothing further needed.

## 2015-01-05 ENCOUNTER — Other Ambulatory Visit: Payer: Self-pay | Admitting: Pulmonary Disease

## 2015-05-04 ENCOUNTER — Encounter: Payer: Self-pay | Admitting: Pulmonary Disease

## 2015-05-04 ENCOUNTER — Ambulatory Visit (INDEPENDENT_AMBULATORY_CARE_PROVIDER_SITE_OTHER): Payer: Medicare Other | Admitting: Pulmonary Disease

## 2015-05-04 VITALS — BP 118/72 | HR 68 | Ht 71.0 in | Wt 143.6 lb

## 2015-05-04 DIAGNOSIS — J309 Allergic rhinitis, unspecified: Secondary | ICD-10-CM

## 2015-05-04 DIAGNOSIS — J45909 Unspecified asthma, uncomplicated: Secondary | ICD-10-CM

## 2015-05-04 DIAGNOSIS — J449 Chronic obstructive pulmonary disease, unspecified: Secondary | ICD-10-CM | POA: Diagnosis not present

## 2015-05-04 NOTE — Progress Notes (Signed)
Current Outpatient Prescriptions on File Prior to Visit  Medication Sig  . albuterol (PROVENTIL HFA) 108 (90 BASE) MCG/ACT inhaler Inhale 2 puffs into the lungs every 4 (four) hours as needed for wheezing.   Marland Kitchen aspirin EC 81 MG tablet Take 81 mg by mouth daily.  Marland Kitchen atorvastatin (LIPITOR) 10 MG tablet Take 10 mg by mouth daily.  . Azelastine HCl 0.15 % SOLN PLACE 2 SPRAYS INTO THE NOSE DAILY.  . calcium-vitamin D (OSCAL WITH D) 500-200 MG-UNIT per tablet Take 2 tablets by mouth daily. 600 mg+d3 400iu  . Cholecalciferol (VITAMIN D) 2000 UNITS CAPS Take 2,000 Units by mouth daily.  . fluticasone (FLONASE) 50 MCG/ACT nasal spray Place 2 sprays into both nostrils daily.  . fluticasone-salmeterol (ADVAIR HFA) 230-21 MCG/ACT inhaler Inhale 2 puffs into the lungs 2 (two) times daily.  Marland Kitchen levothyroxine (SYNTHROID, LEVOTHROID) 75 MCG tablet Take 75 mcg by mouth daily.  Marland Kitchen lisinopril (PRINIVIL,ZESTRIL) 10 MG tablet Take 10 mg by mouth daily.   . metoprolol tartrate (LOPRESSOR) 25 MG tablet Take 25 mg by mouth 3 (three) times daily.   . Multiple Vitamin (MULTIVITAMIN WITH MINERALS) TABS Take 1 tablet by mouth daily.  . nitroGLYCERIN (NITROSTAT) 0.4 MG SL tablet Place 0.4 mg under the tongue every 5 (five) minutes as needed for chest pain.  . prasugrel (EFFIENT) 10 MG TABS tablet Take 1 tablet (10 mg total) by mouth daily.   No current facility-administered medications on file prior to visit.    Tests PFT 07/16/07 >> FEV1 1.76 (66%), FEV1% 40, TLC 8.30 (126%), DLCO 96%, + BD   Past medical history HTN, CAD, HLD, CVA, GERD, Prostate cancer, Hypothyroidism  Past surgical hx, Allergies, Family hx, Social hx all reviewed.  Vital signs BP 118/72 mmHg  Pulse 68  Ht 5\' 11"  (1.803 m)  Wt 143 lb 9.6 oz (65.137 kg)  BMI 20.04 kg/m2  SpO2 97%  History of Present Illness: Joe Soto is a 83 y.o. male former smoker with COPD/asthma and allergic rhinitis.  His breathing has been doing well.  He has  not needed albuterol or advair recently.  He is not having cough, wheeze, or sputum.  He has noticed more trouble with runny nose.  He has not been using flonase recenlty.  Physical Exam:  General - No distress, thin ENT - No sinus tenderness, no oral exudate, no LAN, clear nasal drainage Cardiac - s1s2 regular, no murmur Chest - No wheeze/rales/dullness Back - No focal tenderness Abd - Soft, non-tender Ext - No edema Neuro - Normal strength Skin - No rashes Psych - normal mood, and behavior   Assessment/Plan:  Allergic rhinitis. Plan: - advised him to try resuming flonase, and continue astepro for now - if these don't help, then he could try atrovent  COPD with asthma. Plan: - he can stop advair - continue albuterol prn   Patient Instructions  Try using flonase for sinus symptoms Call if you want to try atrovent nasal spray to help with runny nose  Can stop advair  Follow up in 1 year     Chesley Mires, MD [place] [facility]/Critical Care/Sleep Pager:  [phone] 05/04/2015, 2:15 PM

## 2015-05-04 NOTE — Patient Instructions (Signed)
Try using flonase for sinus symptoms Call if you want to try atrovent nasal spray to help with runny nose  Can stop advair  Follow up in 1 year

## 2015-07-26 ENCOUNTER — Ambulatory Visit: Payer: Medicare Other | Admitting: Internal Medicine

## 2015-08-07 DIAGNOSIS — I255 Ischemic cardiomyopathy: Secondary | ICD-10-CM | POA: Diagnosis present

## 2015-08-07 NOTE — H&P (Signed)
OFFICE VISIT NOTES COPIED TO EPIC FOR DOCUMENTATION  History of Present Illness Neldon Labella AGNP-C; 08/04/2015 6:56 PM) The patient is a 82 year old male who presents for a follow-up for Coronary artery disease. Mrs. Decorian Schuenemann is a fairly active Caucasian male with very mild hyperlipidemia, coronary artery disease and fatigue. He had undergone successful angioplasty to his proximal LAD on 06/15/2013, since then EF improved from 30% to low normal. He states that since being on nocturnal oxygen and over the last 6 months he has regained all his strength and hasresumed all his activities without any limitations.  He is presently on nocturnal oxygen at night due to nocturnal hypoxemia, sleep study had revealed severe restless leg syndrome. He had felt well for 6 months, but states that in the past 2-3 months he has noticed marked increase in fatigue, mild dyspnea on exertion and occasional episodes of chest discomfort with exertion activity. He thinks that symptoms of CAD are occurring again. Accompanied by his wife at the bedside.  He discontinued atorvastatin due to myalgias, and stop metoprolol due to fatigue after discussing with his next-door neighbor. This was about 2-3 months ago. No symptoms of TIA but has noticed severe cramping in his legs left leg worse than the right and weakness in his lower extremities outsides with activity. Symptoms of claudication are more prominent in bilateral calves. He was scheduled for stress test, echo, and lower extremity duplex and presents today for follow up.  Problem List/Past Medical [name]; 08/03/2015 2:33 PM) Chest pain, exertional (R07.9)  Palpitations (R00.2)  Essential hypertension, benign (I10)  Angina pectoris (I20.9)  06/15/2013: PTCA and stenting of the proximal LAD with implantation of a 2.5 x 18 mm Xience Alpine DES. Mild disease in the other vessels, diffusely diseased LAD. Bilateral carotid bruits (R09.89)  Carotid  artery duplex 07/21/2015: Stenosis in the right internal carotid artery (50-69%) and Left ICA 16-49%. Severe mixed plaque throughout. < 50% left bulb stenosis. Antegrade right vertebral artery flow. Antegrade left vertebral artery flow. Follow up in six months is appropriate if clinically indicated. Compared to 12/21/2014, no significant change. Hyperlipidemia, mild (E78.5)  Malaise and fatigue (R53.81, R53.83)  Shortness of breath (R06.02)  Atherosclerosis of native coronary artery of native heart with angina pectoris (I25.119)  06/15/2013: PTCA and stenting of the proximal LAD with implantation of a 2.5 x 18 mm Xience Alpine DES. Mild disease in the other vessels, diffusely diseased LAD. Echocardiogram 08/17/2013: 1. Left ventricle cavity is normal in size. Mild concentric hypertrophy of the left ventricle. Normal global wall motion. Visual EF is 50-55%. No wall motion abnormalities. Grade I diastolic dysfunction (relaxation abnormality) with normal LA/LV end diastolic pressure. Frequent PVC and PAC noted. 2. Left atrial cavity is mildly dilated. 3. Trace aortic regurgitation. No evidence of aortic valve stenosis. 4. Mild to moderate mitral regurgitation. Mild tricuspid regurgitation. No pulmonary hypertension. 5. Compared to 06/10/2013, EF improved from 30-35%. Anterior wall motion abnormality no longer present. Chronic kidney disease, stage 3 (N18.3)  PVC (premature ventricular contraction) (I49.3)  Chronic Restless leg syndrome (G25.81)  Office visit 05/26/2014: Schedule for sleep study with potential positive airway pressure titration. OV Note Dr Star Age 07/09/2014: Disturbed sleep due to restless leg-severe, Try Gabapentin. Presently on nocturnal O2 therapy. Labwork  Labs 05/14/2014: Total cholesterol 141, triglycerides 62, HDL 40, LDL 89, LDL particle # 1117, TSH 11.150, CMP normal Labs 08/24/2013: Total cholesterol 144, triglycerides 63, HDL 44, LDL 87. LDL particle #918. Hemoglobin  10.0/hematocrit 28.1. Macrocytic  indicis, MCV 103, MCH 36.5. Serum glucose 100 mg, BUN 24, serum creatinine 1.10, eGFR 60 mL. Labs 06/01/2013: BUN 21, serum creatinine 1.25, eGFR 51 mL TSH was minimally elevated at 4.700. Claudication, intermittent (I73.9)  Cough (R05)  Asthma (J45.909)  GERD (gastroesophageal reflux disease) (K21.9)  Asymptomatic bilateral carotid artery stenosis (K74.25) 06/17/2013 Carotid artery duplex 07/21/2015: Stenosis in the right internal carotid artery (50-69%) and Left ICA 16-49%. Severe mixed plaque throughout. < 50% left bulb stenosis. Antegrade right vertebral artery flow. Antegrade left vertebral artery flow. Follow up in six months is appropriate if clinically indicated. Compared to 12/21/2014, no significant change. Nocturnal hypoxemia (G47.34) 06/30/2013 Office visit 05/26/2014: Schedule for sleep study with potential positive airway pressure titration. OV Note Dr Star Age 07/09/2014: Disturbed sleep due to restless leg-severe, Try Gabapentin. He is also on Home O2 Nocturnal oximetry 04/12/2014: Time < 89%: 51.5 minutes, low SPO2 74%, total desaturation events: 8. This patient qualifies for nocturnal oxygen per Medicare's Group 1.  Allergies [name]; 2015-08-21 2:33 PM) Darvon *ANALGESICS - OPIOID*  Nausea. Lisinopril *ANTIHYPERTENSIVES* 07/25/2015 06:04 PM Cough. Lipitor *ANTIHYPERLIPIDEMICS* 07/25/2015 06:04 PM Myalgia  Family History [name]; 08-21-2015 2:33 PM) Mother  Deceased. at age 48, from Emphysema; Known HTN Father  Deceased. at age 18, from Heart Attack; Known HTN Siblings  1 sister, 5 years younger  Social History [name]; 08/21/15 2:33 PM) Current tobacco use  Former smoker. quit 1950 Alcohol Use  Occasional alcohol use. Marital status  Married. Number of Children  2. Living Situation  Lives with spouse.  Past Surgical History [name]; 2015-08-21 2:33 PM) Knee Replacement, Total   Right. 2014  Medication History [name]; 08/21/15 2:43 PM) Valsartan (160MG Tablet, 1 (one) Tablet Oral daily, Taken starting 07/25/2015) Active. (Discontinue Lisinopril - cough) Atenolol (25MG Tablet, 1 (one) Tablet Oral daily, Taken starting 07/25/2015) Active. Aspirin (325MG Tablet, 1 (one) Tablet Tablet Tablet Oral daily, Taken starting 07/12/2014) Active. Nitrostat (0.4MG Tab Sublingual, 1 (one) Tab Sublingual Tab Sub Sublingual every 5 minutes as needed for chest pain., Taken starting 05/28/2013) Active. Advair HFA (230-21MCG/ACT Aerosol, 2 puffs Inhalation two times daily only as needed) Active. Calcium 600 plus Vitamin D3 400 iu (Free Text)  (1 weekly) Active. (Pt does not take consistantly) Proventil HFA (108 (90 Base)MCG/ACT Aerosol Soln, 1 puff Inhalation as needed) Active. Medications Reconciled (verbally)  Diagnostic Studies History [name]; 08/21/2015 2:35 PM) Nuclear stress test 08/01/2015 1. Resting EKG demonstrates normal sinus rhythm, left axis deviation, left anterior fascicular block. Poor R-wave progression, IRBBB. Stress EKG was positive for myocardial ischemia with frequent PVCs, ventricular couplets, PACs. Patient developed right bundle branch block with exercise. In addition there was ST segment depression of 1 mm with T-wave inversion noted at 3 minutes into recovery. The patient performed treadmill exercise using a Bruce protocol, completing 3:20 minutes and achieved 4.64 METS and 98% of the MPHR. Normal BP response. Stress symptoms included dyspnea. 2. The perfusion imaging study demonstrates the left ventricle to be dilated both impressed and more so in stress images, LV end-diastolic pressure 956 mL. There is a moderate sized severe ischemia in the inferior and inferolateral wall extending from the base towards the mid ventricle. In addition there is a small sized anterolateral nontransmural myocardial infarction with moderate peri-infarct  ischemia. Left ventricular systolic function calculated by QGS was markedly depressed at 28% with global hypokinesis and inferior and anterolateral, anteroapical hypokinesis. 3. Compared to the study done on 06/01/2013, apical lateral scar was noted history.  Inferior and infero-lateral wall ischemia is new. Patient's exercise capacity is increased from 2 minutes to the present 3 minutes and 20 seconds. EF previously was 50%. Overall this represents a high risk study. Nocturnal oxymetry:  06/30/2013: SPO2 less than 88% 27 minutes, less than 89% 28 minutes. Time considered to less than 88% 16 minutes. Total desaturation events 41. Patient qualifies for nocturnal home oxygen. Colonoscopy  normal; over 5 years ago Carotid Doppler  Carotid duplex 06/17/2013: Moderate stenosis of the bilateral distal internal carotid artery, mid internal carotid artery and proximal internal carotid artery (>50% stenosis in the range of 50-69%). Follow up in six months is appropriate if clinically indicated. Coronary Angiogram 06/15/2013 stent placed Echocardiogram 08/17/2013 1. Left ventricle cavity is normal in size. Mild concentric hypertrophy of the left ventricle. Normal global wall motion. Visual EF is 50-55%. No wall motion abnormalities. Grade I diastolic dysfunction (relaxation abnormality) with normal LA/LV end diastolic pressure. Frequent PVC and PAC noted. 2. Left atrial cavity is mildly dilated. 3. Aortic valve is tricuspid with trace regurgitation. No evidence of aortic valve stenosis. 4. Mild to moderate mitral regurgitation. Mild tricuspid regurgitation. No pulmonary hypertension. 5. Compared to 06/10/2013, EF improved from 30-35%. Anterior wall motion abnormality no longer present. 06/10/13 1. Left ventricle cavity is normal in size. Whole apex is akinetic, septum & ant. wall are hypokinetic. Severely depressed LV systolic function, visually estimated EF is 30-35%. 2. Left atrial cavity is moderately dilated. 3.  Tricuspid aortic valve with mild regurgitation. 4. Moderate mitral regurgitation. Mild calcification of the mitral valve annulus. 5. Normal tricuspid valve with mild to moderate regurgitation. Moderate pulmonary hypertension with approx. PA systolic pressure of 45 mm of Hg. 6. Small pericardial effusion. Carotid Artery Duplex 12/17/2013 Mild stenosis of the bilateral distal internal carotid artery, mid internal carotid artery and proximal internal carotid artery (<50% stenosis in the range of 16-49%). Follow up in one year is appropriate if clinically indicated. Sleep Study 04/2014 Dr Rexene Alberts [facility] for Sleep Apnea    Review of Systems [facility] Ebony Hail, [place]; 08/04/2015 6:56 PM) General Present- Fatigue. Not Present- Fever and Night Sweats. Skin Not Present- Itching and Rash. HEENT Not Present- Headache. Respiratory Present- Difficulty Breathing and Wakes up from Sleep Wheezing or Short of Breath. Cardiovascular Present- Chest Pain and Claudications. Not Present- Fainting and Swelling of Extremities. Gastrointestinal Not Present- Abdominal Pain, Constipation, Diarrhea, Nausea and Vomiting. Musculoskeletal Present- Leg Weakness. Not Present- Joint Swelling. Neurological Not Present- Headaches. Hematology Not Present- Blood Clots, Easy Bruising and Nose Bleed.  Vitals [name]; 08/03/2015 2:45 PM) 08/03/2015 2:38 PM Weight: 146 lb Height: 71in Body Surface Area: 1.84 m Body Mass Index: 20.36 kg/m  Pulse: 58 (Regular)  P.OX: 96% (Room air) BP: 160/60 (Sitting, Left Arm, Standard)  Physical Exam (Bridgette Ebony Hail, AGNP-C; 08/04/2015 6:56 PM) General Mental Status-Alert. General Appearance-Cooperative and Appears stated age. Build & Nutrition-Well built and Well nourished.  Head and Neck Thyroid Gland Characteristics - normal size and consistency and no palpable nodules.  Chest and Lung Exam Chest and lung exam reveals -quiet, even and easy respiratory  effort with no use of accessory muscles, non-tender and on auscultation, normal breath sounds, no adventitious sounds.  Cardiovascular Cardiovascular examination reveals -carotid auscultation reveals no bruits and abdominal aorta auscultation reveals no bruits and no prominent pulsation. Auscultation Rhythm - Frequent Ectopy. Heart Sounds - Normal heart sounds. Murmurs & Other Heart Sounds: Murmur - Location - Aortic Area. Timing - Early systolic. Grade - II/VI.  Abdomen  Palpation/Percussion Normal exam - Non Tender and No hepatosplenomegaly.  Peripheral Vascular Lower Extremity Inspection - Bilateral - Inspection Normal. Palpation - Edema - Left - 2+ Pitting edema. Right - No edema. Femoral pulse - Left - 2+(prominant bruit). Right - Normal. Popliteal pulse - Left - 1+. Right - Normal. Dorsalis pedis pulse - Left - Absent. Right - 2+. Posterior tibial pulse - Bilateral - Absent. Carotid arteries - Bilateral-Soft Bruit.  Neurologic Neurologic evaluation reveals -alert and oriented x 3 with no impairment of recent or remote memory. Motor-Grossly intact without any focal deficits.  Musculoskeletal Global Assessment Left Lower Extremity - no deformities, masses or tenderness, no known fractures. Right Lower Extremity - no deformities, masses or tenderness, no known fractures.  Assessment & Plan (Bridgette Ebony Hail AGNP-C; 08/04/2015 6:54 PM) Atherosclerosis of native coronary artery of native heart with angina pectoris (I25.119) Story: 06/15/2013: PTCA and stenting of the proximal LAD with implantation of a 2.5 x 18 mm Xience Alpine DES. Mild disease in the other vessels, diffusely diseased LAD.  Echocardiogram 08/03/2015: 1. Left ventricle cavity is mildly enlarged at 5.7 cm. Mild concentric hypertrophy of the left ventricle. Severe diffuse hypokinesis and inferior and inferolateral hypokinesis. Doppler evidence of grade II (pseudonormal) diastolic dysfunction. Diastolic dysfunction  findings suggests elevated LA/LV end diastolic pressure. Calculated EF 25%. 2. Left atrial cavity is moderately dilated at 4.7 cm. 3. Mild (Grade I) aortic regurgitation. 4. Moderate to severe posteriorly idrected mitral regurgitation, consider papillary muscle dysfunction. 5. Moderate tricuspid regurgitation. Severe pulmonary hypertension. Pulmonary artery systolic pressure is estimated at 66 mm Hg. 6. Mild to moderate pulmonic regurgitation. 7. IVC is dilated with blunted respiratory response. 8. Compared to the study done on 03/22/2013, EF decreased from 55%. Abnormal Echocardiogram.   Exercise myoview stress 08/01/2015: 1. Resting EKG demonstrates normal sinus rhythm, left axis deviation, left anterior fascicular block.  Poor R-wave progression, IRBBB. Stress EKG was positive for myocardial ischemia with frequent PVCs, ventricular couplets, PACs.  Patient developed right bundle branch block with exercise.  In addition there was ST segment depression of 1 mm with T-wave inversion noted at 3 minutes into recovery. The patient performed treadmill exercise using a Bruce protocol, completing 3:20 minutes and achieved 4.64 METS and 98% of the MPHR. Normal BP response. Stress symptoms included dyspnea. 2. The perfusion imaging study demonstrates the left ventricle to be dilated both impressed and more so in stress images, LV end-diastolic pressure 876 mL.  There is a moderate sized severe ischemia in the inferior and inferolateral wall extending from the base towards the mid ventricle.  In addition there is a small sized anterolateral nontransmural myocardial infarction with moderate peri-infarct ischemia.  Left ventricular systolic function calculated by QGS was markedly depressed at 28% with global hypokinesis and inferior and anterolateral, anteroapical hypokinesis.  3. Compared to the study done on 06/01/2013, apical lateral scar was noted history.  Inferior and infero-lateral wall ischemia is new.   Patient's exercise capacity is increased from 2 minutes to the present 3 minutes and 20 seconds.  EF previously was 50%.  Overall this represents a high risk study.  Impression: EKG 07/25/2015: Normal sinus rhythm at rate of 75 bpm, left axis deviation, left anterior fascicular block. Right bundle branch block. PVCs. Prominent S waves lateral leads, cannot exclude RVH. No significant change from EKG 01/21/2015. Current Plans Started Crestor 5MG, 1 (one) Tablet daily, #30, 08/03/2015, Ref. x1. Shortness of breath (R06.02) Future Plans Claudication, intermittent (I73.9) Future Plans 08/10/2015: LE arterial dopplers (81157) -  one time Cough (R05) Essential hypertension, benign (I10)  Labwork  Labs 08/04/2015: Vitamin D 41.6, TSH elevated at 6.880. FT3 total T4 normal.  HB 10.2/HCT 30.7, microcytic indicis.  Platelets 255.  Total cholesterol 154, triglycerides 36, HDL 53, LDL 94.  LDL particles atypical and 18.  BUN 24, serum creatinine 1.18, eGFR 54 mL.  Potassium 4.4.  Pro time 12.3, within normal limits.    Labs 05/14/2014: Total cholesterol 141, triglycerides 62, HDL 40, LDL 89, LDL particle # 1117, TSH 11.150, CMP normal  Labs 05/14/2014: Total cholesterol 141, triglycerides 62, HDL 40, LDL 89, LDL particle # 1117, TSH 11.150, CMP normal  Labs 08/24/2013: Total cholesterol 144, triglycerides 63, HDL 44, LDL 87. LDL particle #918. Hemoglobin 10.0/hematocrit 28.1. Macrocytic indicis, MCV 103, MCH 36.5. Serum glucose 100 mg, BUN 24, serum creatinine 1.10, eGFR 60 mL.  Labs 06/01/2013: BUN 21, serum creatinine 1.25, eGFR 51 mL TSH was minimally elevated at 4.700. Hyperlipidemia, mild (E78.5) Current Plans LIPOPROTEIN, BLD, BY NMR (78675) Asymptomatic bilateral carotid artery stenosis (Q49.20) Story: Carotid artery duplex 07/21/2015: Stenosis in the right internal carotid artery (50-69%) and Left ICA 16-49%. Severe mixed plaque throughout. < 50% left bulb stenosis. Antegrade right vertebral artery  flow. Antegrade left vertebral artery flow. Follow up in six months is appropriate if clinically indicated. Compared to 12/21/2014, no significant change. Future Plans 01/18/2016: Duplex scan bilateral carotid arteries (10071) - one time Malaise and fatigue (R53.81)   Current Plans Mechanism of underlying disease process and action of medications discussed with the patient. I discussed primary/secondary prevention. He had been scheduled for a nuclear stress test to evaluate for progression of CAD due to symptoms which revealed a dilated LV, both at rest and more so at stress, with a moderate size severe ischemia in the inferior and inferolateral wall extending from the base toward the mid ventricle with a small-sized anterolateral nontransmural infarction with moderate peri-infarct ischemia and depressed EF 28% with global hypokinesis. Patient was brought back to discuss these test results in person to ensure he understood. Would recommend proceeding with coronary angiogram for further evaluation of coronary anatomy and possible PCI. We discussed regarding risks, benefits, alternatives to this including CTA and continued medical therapy. Patient wants to proceed. Understands <1-2% risk of death, stroke, MI, urgent CABG, bleeding, infection, renal failure but not limited to these. I have also added a very low-dose of Crestor. Patient previously unable to tolerate atorvastatin and will increase dose of Crestor as tolerated. We'll obtain a lipid panel with precatheterization labs. Patient is still very uncertain about his medications. He was given a printout of medications that he is supposed to be taking and instructed to compare this to the medications that he is currently taking at home, or he can also return and bring all of his medications and we can ensure that he is taking the appropriate medications and doses at the appropriate time. All questions answered to his and his wife's satisfaction. Follow-up  after catheterization for reevaluation and further recommendations.  Signed by Neldon Labella, AGNP-C (08/04/2015 6:56 PM)

## 2015-08-08 ENCOUNTER — Other Ambulatory Visit: Payer: Self-pay | Admitting: Pulmonary Disease

## 2015-08-08 ENCOUNTER — Telehealth: Payer: Self-pay | Admitting: Pulmonary Disease

## 2015-08-08 MED ORDER — ALBUTEROL SULFATE HFA 108 (90 BASE) MCG/ACT IN AERS: INHALATION_SPRAY | RESPIRATORY_TRACT | 5 refills | Status: DC

## 2015-08-08 NOTE — Telephone Encounter (Signed)
Last ov with VS on 05/04/15    Return in about 1 year (around 05/03/2016).  Try using flonase for sinus symptoms Call if you want to try atrovent nasal spray to help with runny nose  Can stop advair  Follow up in 1 year    Pt came into the office today. He states he is having cardiac surgery tomorrow morning and needs a refill on his proventil inhaler. I explained to him that this medication is no longer on his med list and I would need approval from VS before sending the medication. Verified pharmacy as [facility]. He voiced understanding and had no further questions.   VS please advise

## 2015-08-08 NOTE — Telephone Encounter (Signed)
Can send order for proventil HFA two puffs every 4 to 6 hours as needed.  Dispense 1 inhaler with 5 refills.

## 2015-08-08 NOTE — Telephone Encounter (Signed)
Pt came into the office. I printed off rx and handed it to the patient. He voiced understanding and had no further questions. Nothing further needed.

## 2015-08-09 ENCOUNTER — Encounter (HOSPITAL_COMMUNITY): Payer: Self-pay | Admitting: Cardiology

## 2015-08-09 ENCOUNTER — Ambulatory Visit (HOSPITAL_COMMUNITY)
Admission: RE | Admit: 2015-08-09 | Discharge: 2015-08-09 | Disposition: A | Discharge status: Home / Self Care | Payer: Medicare Other | Source: Ambulatory Visit | Attending: Cardiology | Admitting: Cardiology

## 2015-08-09 ENCOUNTER — Encounter (HOSPITAL_COMMUNITY)
Admission: RE | Disposition: A | Discharge status: Home / Self Care | Payer: Self-pay | Source: Ambulatory Visit | Attending: Cardiology

## 2015-08-09 DIAGNOSIS — Z9981 Dependence on supplemental oxygen: Secondary | ICD-10-CM | POA: Diagnosis not present

## 2015-08-09 DIAGNOSIS — Z7951 Long term (current) use of inhaled steroids: Secondary | ICD-10-CM | POA: Diagnosis not present

## 2015-08-09 DIAGNOSIS — E785 Hyperlipidemia, unspecified: Secondary | ICD-10-CM | POA: Diagnosis not present

## 2015-08-09 DIAGNOSIS — Z96651 Presence of right artificial knee joint: Secondary | ICD-10-CM | POA: Diagnosis not present

## 2015-08-09 DIAGNOSIS — Z7982 Long term (current) use of aspirin: Secondary | ICD-10-CM | POA: Insufficient documentation

## 2015-08-09 DIAGNOSIS — G4736 Sleep related hypoventilation in conditions classified elsewhere: Secondary | ICD-10-CM | POA: Insufficient documentation

## 2015-08-09 DIAGNOSIS — I6523 Occlusion and stenosis of bilateral carotid arteries: Secondary | ICD-10-CM | POA: Diagnosis not present

## 2015-08-09 DIAGNOSIS — Z955 Presence of coronary angioplasty implant and graft: Secondary | ICD-10-CM | POA: Diagnosis not present

## 2015-08-09 DIAGNOSIS — Z8249 Family history of ischemic heart disease and other diseases of the circulatory system: Secondary | ICD-10-CM | POA: Insufficient documentation

## 2015-08-09 DIAGNOSIS — Z87891 Personal history of nicotine dependence: Secondary | ICD-10-CM | POA: Diagnosis not present

## 2015-08-09 DIAGNOSIS — N183 Chronic kidney disease, stage 3 (moderate): Secondary | ICD-10-CM | POA: Diagnosis not present

## 2015-08-09 DIAGNOSIS — K219 Gastro-esophageal reflux disease without esophagitis: Secondary | ICD-10-CM | POA: Diagnosis not present

## 2015-08-09 DIAGNOSIS — I255 Ischemic cardiomyopathy: Secondary | ICD-10-CM | POA: Diagnosis present

## 2015-08-09 DIAGNOSIS — J45909 Unspecified asthma, uncomplicated: Secondary | ICD-10-CM | POA: Diagnosis not present

## 2015-08-09 DIAGNOSIS — R627 Adult failure to thrive: Secondary | ICD-10-CM | POA: Insufficient documentation

## 2015-08-09 DIAGNOSIS — I2510 Atherosclerotic heart disease of native coronary artery without angina pectoris: Secondary | ICD-10-CM | POA: Diagnosis present

## 2015-08-09 DIAGNOSIS — I2584 Coronary atherosclerosis due to calcified coronary lesion: Secondary | ICD-10-CM | POA: Diagnosis not present

## 2015-08-09 DIAGNOSIS — I25119 Atherosclerotic heart disease of native coronary artery with unspecified angina pectoris: Secondary | ICD-10-CM | POA: Diagnosis not present

## 2015-08-09 DIAGNOSIS — G2581 Restless legs syndrome: Secondary | ICD-10-CM | POA: Diagnosis not present

## 2015-08-09 DIAGNOSIS — I129 Hypertensive chronic kidney disease with stage 1 through stage 4 chronic kidney disease, or unspecified chronic kidney disease: Secondary | ICD-10-CM | POA: Insufficient documentation

## 2015-08-09 HISTORY — PX: CARDIAC CATHETERIZATION: SHX172

## 2015-08-09 SURGERY — LEFT HEART CATH AND CORONARY ANGIOGRAPHY

## 2015-08-09 MED ORDER — LIDOCAINE HCL (PF) 1 % IJ SOLN
INTRAMUSCULAR | Status: AC
  Filled 2015-08-09: qty 30

## 2015-08-09 MED ORDER — HEPARIN (PORCINE) IN NACL 2-0.9 UNIT/ML-% IJ SOLN
INTRAMUSCULAR | Status: DC | PRN
  Administered 2015-08-09: 1000 mL

## 2015-08-09 MED ORDER — SODIUM CHLORIDE 0.9% FLUSH: 3.0000 mL | Freq: Two times a day (BID) | INTRAVENOUS | Status: DC

## 2015-08-09 MED ORDER — FUROSEMIDE 10 MG/ML IJ SOLN
INTRAMUSCULAR | Status: AC
  Filled 2015-08-09: qty 4

## 2015-08-09 MED ORDER — ASPIRIN 81 MG PO CHEW
CHEWABLE_TABLET | ORAL | Status: AC
  Filled 2015-08-09: qty 1

## 2015-08-09 MED ORDER — FENTANYL CITRATE (PF) 100 MCG/2ML IJ SOLN
INTRAMUSCULAR | Status: DC | PRN
  Administered 2015-08-09: 25 ug via INTRAVENOUS

## 2015-08-09 MED ORDER — FUROSEMIDE 20 MG PO TABS: 20.0000 mg | ORAL_TABLET | Freq: Every day | ORAL | 1 refills | Status: DC

## 2015-08-09 MED ORDER — SODIUM CHLORIDE 0.9 % IV SOLN
INTRAVENOUS | Status: DC | PRN
  Administered 2015-08-09: 1.75 mg/kg/h via INTRAVENOUS

## 2015-08-09 MED ORDER — IOPAMIDOL (ISOVUE-370) INJECTION 76%
INTRAVENOUS | Status: AC
  Filled 2015-08-09: qty 100

## 2015-08-09 MED ORDER — HEPARIN (PORCINE) IN NACL 2-0.9 UNIT/ML-% IJ SOLN
INTRAMUSCULAR | Status: AC
  Filled 2015-08-09: qty 1000

## 2015-08-09 MED ORDER — HYDRALAZINE HCL 20 MG/ML IJ SOLN
INTRAMUSCULAR | Status: AC
  Filled 2015-08-09: qty 1

## 2015-08-09 MED ORDER — BIVALIRUDIN 250 MG IV SOLR
INTRAVENOUS | Status: AC
  Filled 2015-08-09: qty 250

## 2015-08-09 MED ORDER — LEVALBUTEROL HCL 0.63 MG/3ML IN NEBU
0.6300 mg | INHALATION_SOLUTION | Freq: Once | RESPIRATORY_TRACT | Status: AC
  Administered 2015-08-09: 0.63 mg via RESPIRATORY_TRACT
  Filled 2015-08-09: qty 3

## 2015-08-09 MED ORDER — SODIUM CHLORIDE 0.9 % IV SOLN: 250.0000 mL | INTRAVENOUS | Status: DC | PRN

## 2015-08-09 MED ORDER — FUROSEMIDE 10 MG/ML IJ SOLN
40.0000 mg | Freq: Once | INTRAMUSCULAR | Status: AC
  Administered 2015-08-09: 40 mg via INTRAVENOUS

## 2015-08-09 MED ORDER — IOPAMIDOL (ISOVUE-370) INJECTION 76%
INTRAVENOUS | Status: DC | PRN
  Administered 2015-08-09: 95 mL

## 2015-08-09 MED ORDER — MIDAZOLAM HCL 2 MG/2ML IJ SOLN
INTRAMUSCULAR | Status: AC
  Filled 2015-08-09: qty 2

## 2015-08-09 MED ORDER — MIDAZOLAM HCL 2 MG/2ML IJ SOLN
INTRAMUSCULAR | Status: DC | PRN
  Administered 2015-08-09: 1 mg via INTRAVENOUS

## 2015-08-09 MED ORDER — NITROGLYCERIN 1 MG/10 ML FOR IR/CATH LAB
INTRA_ARTERIAL | Status: AC
  Filled 2015-08-09: qty 10

## 2015-08-09 MED ORDER — HYDRALAZINE HCL 20 MG/ML IJ SOLN
10.0000 mg | Freq: Once | INTRAMUSCULAR | Status: AC
  Administered 2015-08-09: 10 mg via INTRAVENOUS

## 2015-08-09 MED ORDER — SODIUM CHLORIDE 0.9 % IV SOLN
INTRAVENOUS | Status: DC
  Administered 2015-08-09: 07:00:00 via INTRAVENOUS

## 2015-08-09 MED ORDER — HEPARIN SODIUM (PORCINE) 1000 UNIT/ML IJ SOLN
INTRAMUSCULAR | Status: DC | PRN
  Administered 2015-08-09: 6000 [IU] via INTRAVENOUS

## 2015-08-09 MED ORDER — CLOPIDOGREL BISULFATE 75 MG PO TABS: 75.0000 mg | ORAL_TABLET | Freq: Every day | ORAL | 1 refills | Status: AC

## 2015-08-09 MED ORDER — FENTANYL CITRATE (PF) 100 MCG/2ML IJ SOLN
INTRAMUSCULAR | Status: AC
  Filled 2015-08-09: qty 2

## 2015-08-09 MED ORDER — LIDOCAINE HCL (PF) 1 % IJ SOLN
INTRAMUSCULAR | Status: DC | PRN
  Administered 2015-08-09: 2 mL

## 2015-08-09 MED ORDER — SODIUM CHLORIDE 0.9 % WEIGHT BASED INFUSION: 1.0000 mL/kg/h | INTRAVENOUS | Status: DC

## 2015-08-09 MED ORDER — SODIUM CHLORIDE 0.9% FLUSH: 3.0000 mL | INTRAVENOUS | Status: DC | PRN

## 2015-08-09 MED ORDER — POTASSIUM CHLORIDE ER 10 MEQ PO TBCR: 10.0000 meq | EXTENDED_RELEASE_TABLET | Freq: Every day | ORAL | 1 refills | Status: AC

## 2015-08-09 MED ORDER — VERAPAMIL HCL 2.5 MG/ML IV SOLN
INTRAVENOUS | Status: AC
  Filled 2015-08-09: qty 2

## 2015-08-09 MED ORDER — ASPIRIN 81 MG PO CHEW
81.0000 mg | CHEWABLE_TABLET | ORAL | Status: AC
  Administered 2015-08-09: 81 mg via ORAL

## 2015-08-09 MED ORDER — HEPARIN SODIUM (PORCINE) 1000 UNIT/ML IJ SOLN
INTRAMUSCULAR | Status: AC
  Filled 2015-08-09: qty 1

## 2015-08-09 MED ORDER — BIVALIRUDIN BOLUS VIA INFUSION - CUPID
INTRAVENOUS | Status: DC | PRN
  Administered 2015-08-09: 48.675 mg via INTRAVENOUS

## 2015-08-09 MED ORDER — HYDRALAZINE HCL 25 MG PO TABS: 25.0000 mg | ORAL_TABLET | Freq: Three times a day (TID) | ORAL | 1 refills | Status: DC

## 2015-08-09 MED ORDER — VERAPAMIL HCL 2.5 MG/ML IV SOLN
INTRA_ARTERIAL | Status: DC | PRN
  Administered 2015-08-09: 08:00:00 via INTRA_ARTERIAL

## 2015-08-09 SURGICAL SUPPLY — 15 items
BALLN EMERGE MR PUSH 1.5X20 (BALLOONS) ×3
BALLN MINITREK RX 2.0X30 (BALLOONS) ×3 IMPLANT
BALLOON EMERGE MR PUSH 1.5X20 (BALLOONS) ×1 IMPLANT
CATH OPTITORQUE TIG 4.0 5F (CATHETERS) ×3 IMPLANT
CATH VISTA GUIDE 6FR JR4 (CATHETERS) ×3 IMPLANT
DEVICE RAD COMP TR BAND LRG (VASCULAR PRODUCTS) ×6 IMPLANT
GLIDESHEATH SLEND A-KIT 6F 20G (SHEATH) ×3 IMPLANT
KIT ENCORE 26 ADVANTAGE (KITS) ×3 IMPLANT
KIT HEART LEFT (KITS) ×3 IMPLANT
PACK CARDIAC CATHETERIZATION (CUSTOM PROCEDURE TRAY) ×3 IMPLANT
TRANSDUCER W/STOPCOCK (MISCELLANEOUS) ×3 IMPLANT
TUBING CIL FLEX 10 FLL-RA (TUBING) ×3 IMPLANT
WIRE COUGAR XT STRL 190CM (WIRE) ×3 IMPLANT
WIRE MAILMAN 182CM (WIRE) ×3 IMPLANT
WIRE SAFE-T 1.5MM-J .035X260CM (WIRE) ×3 IMPLANT

## 2015-08-09 NOTE — Interval H&P Note (Signed)
History and Physical Interval Note:  08/09/2015 6:38 AM  Joe Soto  has presented today for surgery, with the diagnosis of abnormal nuc - cp  The various methods of treatment have been discussed with the patient and family. After consideration of risks, benefits and other options for treatment, the patient has consented to  Procedure(s): Left Heart Cath and Coronary Angiography (N/A)  And possible PCI as a surgical intervention .  The patient's history has been reviewed, patient examined, no change in status, stable for surgery.  I have reviewed the patient's chart and labs.  Questions were answered to the patient's satisfaction.    Ischemic Symptoms? CCS III (Marked limitation of ordinary activity) Anti-ischemic Medical Therapy? Maximal Medical Therapy (2 or more classes of medications) Non-invasive Test Results? High-risk stress test findings: cardiac mortality >3%/yr Prior CABG? No Previous CABG   Patient Information:   1-2V CAD, no prox LAD  A (9)  Indication: 19; Score: 9   Patient Information:   CTO of 1 vessel, no other CAD  A (8)  Indication: 29; Score: 8   Patient Information:   1V CAD with prox LAD  A (9)  Indication: 35; Score: 9   Patient Information:   2V-CAD with prox LAD  A (9)  Indication: 41; Score: 9   Patient Information:   3V-CAD without LMCA  A (9)  Indication: 47; Score: 9   Patient Information:   3V-CAD without LMCA With Abnormal LV systolic function  A (9)  Indication: 48; Score: 9   Patient Information:   LMCA-CAD  A (9)  Indication: 49; Score: 9   Patient Information:   2V-CAD with prox LAD PCI  A (7)  Indication: 62; Score: 7   Patient Information:   2V-CAD with prox LAD CABG  A (8)  Indication: 62; Score: 8   Patient Information:   3V-CAD without LMCA With Low CAD burden(i.e., 3 focal stenoses, low SYNTAX score) PCI  A (7)  Indication: 63; Score: 7   Patient Information:   3V-CAD without  LMCA With Low CAD burden(i.e., 3 focal stenoses, low SYNTAX score) CABG  A (9)  Indication: 63; Score: 9   Patient Information:   3V-CAD without LMCA E06c - Intermediate-high CAD burden (i.e., multiple diffuse lesions, presence of CTO, or high SYNTAX score) PCI  U (4)  Indication: 64; Score: 4   Patient Information:   3V-CAD without LMCA E06c - Intermediate-high CAD burden (i.e., multiple diffuse lesions, presence of CTO, or high SYNTAX score) CABG  A (9)  Indication: 64; Score: 9   Patient Information:   LMCA-CAD With Isolated LMCA stenosis  PCI  U (6)  Indication: 65; Score: 6   Patient Information:   LMCA-CAD With Isolated LMCA stenosis  CABG  A (9)  Indication: 65; Score: 9   Patient Information:   LMCA-CAD Additional CAD, low CAD burden (i.e., 1- to 2-vessel additional involvement, low SYNTAX score) PCI  U (5)  Indication: 66; Score: 5   Patient Information:   LMCA-CAD Additional CAD, low CAD burden (i.e., 1- to 2-vessel additional involvement, low SYNTAX score) CABG  A (9)  Indication: 66; Score: 9   Patient Information:   LMCA-CAD Additional CAD, intermediate-high CAD burden (i.e., 3-vessel involvement, presence of CTO, or high SYNTAX score) PCI  I (3)  Indication: 67; Score: 3   Patient Information:   LMCA-CAD Additional CAD, intermediate-high CAD burden (i.e., 3-vessel involvement, presence of CTO, or high SYNTAX score) CABG  A (9)  Indication: 67; Score: 9   Joe Soto

## 2015-08-09 NOTE — Discharge Instructions (Signed)
Radial Site Care °Refer to this sheet in the next few weeks. These instructions provide you with information about caring for yourself after your procedure. Your health care provider may also give you more specific instructions. Your treatment has been planned according to current medical practices, but problems sometimes occur. Call your health care provider if you have any problems or questions after your procedure. °WHAT TO EXPECT AFTER THE PROCEDURE °After your procedure, it is typical to have the following: °· Bruising at the radial site that usually fades within 1-2 weeks. °· Blood collecting in the tissue (hematoma) that may be painful to the touch. It should usually decrease in size and tenderness within 1-2 weeks. °HOME CARE INSTRUCTIONS °· Take medicines only as directed by your health care provider. °· You may shower 24-48 hours after the procedure or as directed by your health care provider. Remove the bandage (dressing) and gently wash the site with plain soap and water. Pat the area dry with a clean towel. Do not rub the site, because this may cause bleeding. °· Do not take baths, swim, or use a hot tub until your health care provider approves. °· Check your insertion site every day for redness, swelling, or drainage. °· Do not apply powder or lotion to the site. °· Do not flex or bend the affected arm for 24 hours or as directed by your health care provider. °· Do not push or pull heavy objects with the affected arm for 24 hours or as directed by your health care provider. °· Do not lift over 10 lb (4.5 kg) for 5 days after your procedure or as directed by your health care provider. °· Ask your health care provider when it is okay to: °¨ Return to work or school. °¨ Resume usual physical activities or sports. °¨ Resume sexual activity. °· Do not drive home if you are discharged the same day as the procedure. Have someone else drive you. °· You may drive 24 hours after the procedure unless otherwise  instructed by your health care provider. °· Do not operate machinery or power tools for 24 hours after the procedure. °· If your procedure was done as an outpatient procedure, which means that you went home the same day as your procedure, a responsible adult should be with you for the first 24 hours after you arrive home. °· Keep all follow-up visits as directed by your health care provider. This is important. °SEEK MEDICAL CARE IF: °· You have a fever. °· You have chills. °· You have increased bleeding from the radial site. Hold pressure on the site. °SEEK IMMEDIATE MEDICAL CARE IF: °· You have unusual pain at the radial site. °· You have redness, warmth, or swelling at the radial site. °· You have drainage (other than a small amount of blood on the dressing) from the radial site. °· The radial site is bleeding, and the bleeding does not stop after 30 minutes of holding steady pressure on the site. °· Your arm or hand becomes pale, cool, tingly, or numb. °  °This information is not intended to replace advice given to you by your health care provider. Make sure you discuss any questions you have with your health care provider. °  °Document Released: 02/03/2010 Document Revised: 01/22/2014 Document Reviewed: 07/20/2013 °[employer] Interactive Patient Education ©2016 [employer] ° °

## 2015-08-09 NOTE — Progress Notes (Signed)
Client received from cath lab via stretcher and c/o shortness of breath and used inhaler from home; called Dr Einar Gip and he will be in

## 2015-08-09 NOTE — Progress Notes (Addendum)
On arrival at 0935  from cath lab client using accessory muscles to breath

## 2015-08-09 NOTE — Progress Notes (Signed)
Dr Einar Gip notified client without shortness of breath

## 2015-08-09 NOTE — Progress Notes (Signed)
Lung sounds with crackles 1/2 way up on left and rhonchi heard on right with decreased breath sounds bilat

## 2015-08-09 NOTE — Progress Notes (Signed)
Client cont to c/o shortness of breath and Dr Einar Gip notified and order noted

## 2015-08-23 ENCOUNTER — Ambulatory Visit (HOSPITAL_COMMUNITY)
Admission: RE | Admit: 2015-08-23 | Discharge: 2015-08-24 | Disposition: A | Discharge status: Home / Self Care | Payer: Medicare Other | Source: Ambulatory Visit | Attending: Cardiology | Admitting: Cardiology

## 2015-08-23 ENCOUNTER — Encounter (HOSPITAL_COMMUNITY): Payer: Self-pay | Admitting: Cardiology

## 2015-08-23 ENCOUNTER — Encounter (HOSPITAL_COMMUNITY)
Admission: RE | Disposition: A | Discharge status: Home / Self Care | Payer: Self-pay | Source: Ambulatory Visit | Attending: Cardiology

## 2015-08-23 DIAGNOSIS — Z7951 Long term (current) use of inhaled steroids: Secondary | ICD-10-CM | POA: Insufficient documentation

## 2015-08-23 DIAGNOSIS — Z8249 Family history of ischemic heart disease and other diseases of the circulatory system: Secondary | ICD-10-CM | POA: Diagnosis not present

## 2015-08-23 DIAGNOSIS — I6523 Occlusion and stenosis of bilateral carotid arteries: Secondary | ICD-10-CM | POA: Insufficient documentation

## 2015-08-23 DIAGNOSIS — I2510 Atherosclerotic heart disease of native coronary artery without angina pectoris: Secondary | ICD-10-CM | POA: Diagnosis present

## 2015-08-23 DIAGNOSIS — K219 Gastro-esophageal reflux disease without esophagitis: Secondary | ICD-10-CM | POA: Insufficient documentation

## 2015-08-23 DIAGNOSIS — J45909 Unspecified asthma, uncomplicated: Secondary | ICD-10-CM | POA: Insufficient documentation

## 2015-08-23 DIAGNOSIS — Z7982 Long term (current) use of aspirin: Secondary | ICD-10-CM | POA: Insufficient documentation

## 2015-08-23 DIAGNOSIS — I2584 Coronary atherosclerosis due to calcified coronary lesion: Secondary | ICD-10-CM | POA: Diagnosis not present

## 2015-08-23 DIAGNOSIS — G2581 Restless legs syndrome: Secondary | ICD-10-CM | POA: Insufficient documentation

## 2015-08-23 DIAGNOSIS — Z87891 Personal history of nicotine dependence: Secondary | ICD-10-CM | POA: Diagnosis not present

## 2015-08-23 DIAGNOSIS — I25119 Atherosclerotic heart disease of native coronary artery with unspecified angina pectoris: Secondary | ICD-10-CM | POA: Diagnosis not present

## 2015-08-23 DIAGNOSIS — I129 Hypertensive chronic kidney disease with stage 1 through stage 4 chronic kidney disease, or unspecified chronic kidney disease: Secondary | ICD-10-CM | POA: Diagnosis not present

## 2015-08-23 DIAGNOSIS — Z955 Presence of coronary angioplasty implant and graft: Secondary | ICD-10-CM | POA: Diagnosis not present

## 2015-08-23 DIAGNOSIS — Z96651 Presence of right artificial knee joint: Secondary | ICD-10-CM | POA: Insufficient documentation

## 2015-08-23 DIAGNOSIS — E785 Hyperlipidemia, unspecified: Secondary | ICD-10-CM | POA: Insufficient documentation

## 2015-08-23 DIAGNOSIS — Z9861 Coronary angioplasty status: Secondary | ICD-10-CM

## 2015-08-23 DIAGNOSIS — N183 Chronic kidney disease, stage 3 (moderate): Secondary | ICD-10-CM | POA: Insufficient documentation

## 2015-08-23 DIAGNOSIS — I081 Rheumatic disorders of both mitral and tricuspid valves: Secondary | ICD-10-CM | POA: Insufficient documentation

## 2015-08-23 HISTORY — PX: CARDIAC CATHETERIZATION: SHX172

## 2015-08-23 HISTORY — DX: Dependence on supplemental oxygen: Z99.81

## 2015-08-23 LAB — POCT ACTIVATED CLOTTING TIME: Activated Clotting Time: 588 seconds

## 2015-08-23 SURGERY — CORONARY STENT INTERVENTION

## 2015-08-23 MED ORDER — NITROGLYCERIN 1 MG/10 ML FOR IR/CATH LAB
INTRA_ARTERIAL | Status: DC | PRN
  Administered 2015-08-23: 200 ug via INTRACORONARY

## 2015-08-23 MED ORDER — SODIUM CHLORIDE 0.9% FLUSH: 3.0000 mL | Freq: Two times a day (BID) | INTRAVENOUS | Status: DC

## 2015-08-23 MED ORDER — IOPAMIDOL (ISOVUE-370) INJECTION 76%
INTRAVENOUS | Status: AC
  Filled 2015-08-23: qty 125

## 2015-08-23 MED ORDER — NITROGLYCERIN 0.4 MG SL SUBL: 0.4000 mg | SUBLINGUAL_TABLET | SUBLINGUAL | Status: DC | PRN

## 2015-08-23 MED ORDER — MIDAZOLAM HCL 2 MG/2ML IJ SOLN
INTRAMUSCULAR | Status: AC
  Filled 2015-08-23: qty 2

## 2015-08-23 MED ORDER — HYDRALAZINE HCL 25 MG PO TABS
25.0000 mg | ORAL_TABLET | Freq: Three times a day (TID) | ORAL | Status: DC
  Administered 2015-08-23 – 2015-08-24 (×2): 25 mg via ORAL
  Filled 2015-08-23 (×4): qty 1

## 2015-08-23 MED ORDER — IOPAMIDOL (ISOVUE-370) INJECTION 76%
INTRAVENOUS | Status: DC | PRN
  Administered 2015-08-23: 60 mL via INTRAVENOUS

## 2015-08-23 MED ORDER — ALBUTEROL SULFATE (2.5 MG/3ML) 0.083% IN NEBU: 2.5000 mg | INHALATION_SOLUTION | Freq: Four times a day (QID) | RESPIRATORY_TRACT | Status: DC | PRN

## 2015-08-23 MED ORDER — ENSURE ENLIVE PO LIQD
237.0000 mL | Freq: Two times a day (BID) | ORAL | Status: DC
  Administered 2015-08-24: 10:00:00 237 mL via ORAL
  Filled 2015-08-23 (×4): qty 237

## 2015-08-23 MED ORDER — SODIUM CHLORIDE 0.9% FLUSH: 3.0000 mL | INTRAVENOUS | Status: DC | PRN

## 2015-08-23 MED ORDER — ONDANSETRON HCL 4 MG/2ML IJ SOLN
4.0000 mg | Freq: Four times a day (QID) | INTRAMUSCULAR | Status: DC | PRN
  Filled 2015-08-23: qty 2

## 2015-08-23 MED ORDER — FENTANYL CITRATE (PF) 100 MCG/2ML IJ SOLN
INTRAMUSCULAR | Status: AC
  Filled 2015-08-23: qty 2

## 2015-08-23 MED ORDER — VERAPAMIL HCL 2.5 MG/ML IV SOLN
INTRAVENOUS | Status: AC
  Filled 2015-08-23: qty 2

## 2015-08-23 MED ORDER — BIVALIRUDIN BOLUS VIA INFUSION - CUPID
INTRAVENOUS | Status: DC | PRN
  Administered 2015-08-23: 47.625 mg via INTRAVENOUS

## 2015-08-23 MED ORDER — SODIUM CHLORIDE 0.9 % IV SOLN
INTRAVENOUS | Status: AC
  Administered 2015-08-23: 12:00:00 via INTRAVENOUS

## 2015-08-23 MED ORDER — ADULT MULTIVITAMIN W/MINERALS CH
1.0000 | ORAL_TABLET | Freq: Every day | ORAL | Status: DC
  Administered 2015-08-23 – 2015-08-24 (×2): 1 via ORAL
  Filled 2015-08-23 (×3): qty 1

## 2015-08-23 MED ORDER — OXYCODONE-ACETAMINOPHEN 5-325 MG PO TABS
1.0000 | ORAL_TABLET | ORAL | Status: DC | PRN
  Administered 2015-08-23: 16:00:00 1 via ORAL
  Filled 2015-08-23: qty 1

## 2015-08-23 MED ORDER — ASPIRIN 81 MG PO CHEW
81.0000 mg | CHEWABLE_TABLET | Freq: Every day | ORAL | Status: DC
  Administered 2015-08-24: 10:00:00 81 mg via ORAL
  Filled 2015-08-23: qty 1

## 2015-08-23 MED ORDER — VERAPAMIL HCL 2.5 MG/ML IV SOLN
INTRA_ARTERIAL | Status: DC | PRN
  Administered 2015-08-23: 10 mL via INTRA_ARTERIAL

## 2015-08-23 MED ORDER — LEVOTHYROXINE SODIUM 75 MCG PO TABS
75.0000 ug | ORAL_TABLET | Freq: Every day | ORAL | Status: DC
  Administered 2015-08-24: 10:00:00 75 ug via ORAL
  Filled 2015-08-23: qty 1

## 2015-08-23 MED ORDER — LIDOCAINE HCL (PF) 1 % IJ SOLN
INTRAMUSCULAR | Status: AC
  Filled 2015-08-23: qty 30

## 2015-08-23 MED ORDER — SODIUM CHLORIDE 0.9 % IV SOLN: 250.0000 mL | INTRAVENOUS | Status: DC | PRN

## 2015-08-23 MED ORDER — SODIUM CHLORIDE 0.9 % IV SOLN
INTRAVENOUS | Status: DC
  Administered 2015-08-23: 10:00:00 via INTRAVENOUS

## 2015-08-23 MED ORDER — LIDOCAINE HCL (PF) 1 % IJ SOLN
INTRAMUSCULAR | Status: DC | PRN
  Administered 2015-08-23: 2 mL

## 2015-08-23 MED ORDER — POTASSIUM CHLORIDE ER 10 MEQ PO TBCR
10.0000 meq | EXTENDED_RELEASE_TABLET | Freq: Every day | ORAL | Status: DC
  Administered 2015-08-23 – 2015-08-24 (×2): 10 meq via ORAL
  Filled 2015-08-23 (×4): qty 1

## 2015-08-23 MED ORDER — FUROSEMIDE 20 MG PO TABS
20.0000 mg | ORAL_TABLET | Freq: Every day | ORAL | Status: DC
  Administered 2015-08-24: 10:00:00 20 mg via ORAL
  Filled 2015-08-23: qty 1

## 2015-08-23 MED ORDER — FENTANYL CITRATE (PF) 100 MCG/2ML IJ SOLN
INTRAMUSCULAR | Status: DC | PRN
  Administered 2015-08-23: 25 ug via INTRAVENOUS

## 2015-08-23 MED ORDER — NITROGLYCERIN 1 MG/10 ML FOR IR/CATH LAB
INTRA_ARTERIAL | Status: AC
  Filled 2015-08-23: qty 10

## 2015-08-23 MED ORDER — BIVALIRUDIN 250 MG IV SOLR
INTRAVENOUS | Status: AC
  Filled 2015-08-23: qty 250

## 2015-08-23 MED ORDER — CALCIUM CARBONATE-VITAMIN D 500-200 MG-UNIT PO TABS
2.0000 | ORAL_TABLET | Freq: Every day | ORAL | Status: DC
  Administered 2015-08-23 – 2015-08-24 (×2): 2 via ORAL
  Filled 2015-08-23 (×4): qty 2

## 2015-08-23 MED ORDER — ALBUTEROL SULFATE HFA 108 (90 BASE) MCG/ACT IN AERS: 1.0000 | INHALATION_SPRAY | Freq: Four times a day (QID) | RESPIRATORY_TRACT | Status: DC | PRN

## 2015-08-23 MED ORDER — SODIUM CHLORIDE 0.9% FLUSH
3.0000 mL | Freq: Two times a day (BID) | INTRAVENOUS | Status: DC
  Administered 2015-08-23: 19:00:00 3 mL via INTRAVENOUS

## 2015-08-23 MED ORDER — ATENOLOL 50 MG PO TABS
25.0000 mg | ORAL_TABLET | Freq: Every day | ORAL | Status: DC
  Administered 2015-08-23 – 2015-08-24 (×2): 25 mg via ORAL
  Filled 2015-08-23 (×3): qty 1

## 2015-08-23 MED ORDER — CLOPIDOGREL BISULFATE 300 MG PO TABS
ORAL_TABLET | ORAL | Status: AC
  Administered 2015-08-23: 600 mg via ORAL
  Filled 2015-08-23: qty 2

## 2015-08-23 MED ORDER — IOPAMIDOL (ISOVUE-370) INJECTION 76%
INTRAVENOUS | Status: AC
  Filled 2015-08-23: qty 100

## 2015-08-23 MED ORDER — MOMETASONE FURO-FORMOTEROL FUM 200-5 MCG/ACT IN AERO
2.0000 | INHALATION_SPRAY | Freq: Two times a day (BID) | RESPIRATORY_TRACT | Status: DC
  Administered 2015-08-23: 21:00:00 2 via RESPIRATORY_TRACT
  Filled 2015-08-23: qty 8.8

## 2015-08-23 MED ORDER — CLOPIDOGREL BISULFATE 75 MG PO TABS
600.0000 mg | ORAL_TABLET | Freq: Once | ORAL | Status: AC
  Administered 2015-08-23: 600 mg via ORAL

## 2015-08-23 MED ORDER — CLOPIDOGREL BISULFATE 75 MG PO TABS
75.0000 mg | ORAL_TABLET | Freq: Every day | ORAL | Status: DC
  Administered 2015-08-24: 10:00:00 75 mg via ORAL
  Filled 2015-08-23: qty 1

## 2015-08-23 MED ORDER — HEPARIN (PORCINE) IN NACL 2-0.9 UNIT/ML-% IJ SOLN
INTRAMUSCULAR | Status: DC | PRN
  Administered 2015-08-23: 1500 mL

## 2015-08-23 MED ORDER — ASPIRIN 81 MG PO CHEW
81.0000 mg | CHEWABLE_TABLET | ORAL | Status: AC
  Administered 2015-08-23: 81 mg via ORAL

## 2015-08-23 MED ORDER — ASPIRIN 81 MG PO CHEW
CHEWABLE_TABLET | ORAL | Status: AC
  Administered 2015-08-23: 81 mg via ORAL
  Filled 2015-08-23: qty 1

## 2015-08-23 MED ORDER — SODIUM CHLORIDE 0.9 % IV SOLN
INTRAVENOUS | Status: DC | PRN
  Administered 2015-08-23: 1.75 mg/kg/h via INTRAVENOUS

## 2015-08-23 MED ORDER — MIDAZOLAM HCL 2 MG/2ML IJ SOLN
INTRAMUSCULAR | Status: DC | PRN
  Administered 2015-08-23: 2 mg via INTRAVENOUS

## 2015-08-23 MED ORDER — HEPARIN (PORCINE) IN NACL 2-0.9 UNIT/ML-% IJ SOLN
INTRAMUSCULAR | Status: AC
  Filled 2015-08-23: qty 1500

## 2015-08-23 MED ORDER — IRBESARTAN 75 MG PO TABS
37.5000 mg | ORAL_TABLET | Freq: Every day | ORAL | Status: DC
  Administered 2015-08-23 – 2015-08-24 (×2): 37.5 mg via ORAL
  Filled 2015-08-23 (×3): qty 1

## 2015-08-23 MED ORDER — ACETAMINOPHEN 325 MG PO TABS
650.0000 mg | ORAL_TABLET | ORAL | Status: DC | PRN
  Administered 2015-08-23: 650 mg via ORAL
  Filled 2015-08-23: qty 2

## 2015-08-23 SURGICAL SUPPLY — 14 items
BALLN EMERGE MR 2.5X30 (BALLOONS) ×3
BALLOON EMERGE MR 2.5X30 (BALLOONS) ×1 IMPLANT
CATH HEARTRAIL 6F IL3.5 (CATHETERS) ×3 IMPLANT
DEVICE RAD COMP TR BAND LRG (VASCULAR PRODUCTS) ×3 IMPLANT
GLIDESHEATH SLEND A-KIT 6F 20G (SHEATH) ×3 IMPLANT
KIT ENCORE 26 ADVANTAGE (KITS) ×6 IMPLANT
KIT HEART LEFT (KITS) ×3 IMPLANT
PACK CARDIAC CATHETERIZATION (CUSTOM PROCEDURE TRAY) ×3 IMPLANT
STENT SYNERGY DES 2.5X38 (Permanent Stent) ×3 IMPLANT
TRANSDUCER W/STOPCOCK (MISCELLANEOUS) ×3 IMPLANT
TUBING CIL FLEX 10 FLL-RA (TUBING) ×3 IMPLANT
WIRE COUGAR XT STRL 190CM (WIRE) ×3 IMPLANT
WIRE HI TORQ VERSACORE-J 145CM (WIRE) ×3 IMPLANT
WIRE SAFE-T 1.5MM-J .035X260CM (WIRE) ×3 IMPLANT

## 2015-08-23 NOTE — Progress Notes (Signed)
TR BAND REMOVAL  LOCATION:    right radial  DEFLATED PER PROTOCOL:    Yes.    TIME BAND OFF / DRESSING APPLIED:    1600   SITE UPON ARRIVAL:    Level 0  SITE AFTER BAND REMOVAL:    Level 0  CIRCULATION SENSATION AND MOVEMENT:    Within Normal Limits   Yes.    COMMENTS:    

## 2015-08-23 NOTE — Care Management Note (Signed)
Case Management Note  Patient Details  Name: Joe Soto MRN: [id number] Date of Birth: Dec 02, 1923  Subjective/Objective:    Patient is s/p coronary stent intervention, home med is plavix,  NCM will cont to follow for dc needs.                Action/Plan:   Expected Discharge Date:                  Expected Discharge Plan:  Home/Self Care  In-House Referral:     Discharge planning Services  CM Consult  Post Acute Care Choice:    Choice offered to:     DME Arranged:    DME Agency:     HH Arranged:    HH Agency:     Status of Service:  In process, will continue to follow  If discussed at Long Length of Stay Meetings, dates discussed:    Additional Comments:  Zenon Mayo, RN 08/23/2015, 2:48 PM

## 2015-08-23 NOTE — H&P (View-Only) (Signed)
OFFICE VISIT NOTES COPIED TO EPIC FOR DOCUMENTATION  History of Present Illness Joe Soto AGNP-C; 08/04/2015 6:56 PM) The patient is a 82 year old male who presents for a follow-up for Coronary artery disease. Joe Soto is a fairly active Caucasian male with very mild hyperlipidemia, coronary artery disease and fatigue. He had undergone successful angioplasty to his proximal LAD on 06/15/2013, since then EF improved from 30% to low normal. He states that since being on nocturnal oxygen and over the last 6 months he has regained all his strength and hasresumed all his activities without any limitations.  He is presently on nocturnal oxygen at night due to nocturnal hypoxemia, sleep study had revealed severe restless leg syndrome. He had felt well for 6 months, but states that in the past 2-3 months he has noticed marked increase in fatigue, mild dyspnea on exertion and occasional episodes of chest discomfort with exertion activity. He thinks that symptoms of CAD are occurring again. Accompanied by his wife at the bedside.  He discontinued atorvastatin due to myalgias, and stop metoprolol due to fatigue after discussing with his next-door neighbor. This was about 2-3 months ago. No symptoms of TIA but has noticed severe cramping in his legs left leg worse than the right and weakness in his lower extremities outsides with activity. Symptoms of claudication are more prominent in bilateral calves. He was scheduled for stress test, echo, and lower extremity duplex and presents today for follow up.  Problem List/Past Medical Joe Soto; 08/03/2015 2:33 PM) Chest pain, exertional (R07.9)  Palpitations (R00.2)  Essential hypertension, benign (I10)  Angina pectoris (I20.9)  06/15/2013: PTCA and stenting of the proximal LAD with implantation of a 2.5 x 18 mm Xience Alpine DES. Mild disease in the other vessels, diffusely diseased LAD. Bilateral carotid bruits (R09.89)  Carotid  artery duplex 07/21/2015: Stenosis in the right internal carotid artery (50-69%) and Left ICA 16-49%. Severe mixed plaque throughout. < 50% left bulb stenosis. Antegrade right vertebral artery flow. Antegrade left vertebral artery flow. Follow up in six months is appropriate if clinically indicated. Compared to 12/21/2014, no significant change. Hyperlipidemia, mild (E78.5)  Malaise and fatigue (R53.81, R53.83)  Shortness of breath (R06.02)  Atherosclerosis of native coronary artery of native heart with angina pectoris (I25.119)  06/15/2013: PTCA and stenting of the proximal LAD with implantation of a 2.5 x 18 mm Xience Alpine DES. Mild disease in the other vessels, diffusely diseased LAD. Echocardiogram 08/17/2013: 1. Left ventricle cavity is normal in size. Mild concentric hypertrophy of the left ventricle. Normal global wall motion. Visual EF is 50-55%. No wall motion abnormalities. Grade I diastolic dysfunction (relaxation abnormality) with normal LA/LV end diastolic pressure. Frequent PVC and PAC noted. 2. Left atrial cavity is mildly dilated. 3. Trace aortic regurgitation. No evidence of aortic valve stenosis. 4. Mild to moderate mitral regurgitation. Mild tricuspid regurgitation. No pulmonary hypertension. 5. Compared to 06/10/2013, EF improved from 30-35%. Anterior wall motion abnormality no longer present. Chronic kidney disease, stage 3 (N18.3)  PVC (premature ventricular contraction) (I49.3)  Chronic Restless leg syndrome (G25.81)  Office visit 05/26/2014: Schedule for sleep study with potential positive airway pressure titration. OV Note Joe Star Age 07/09/2014: Disturbed sleep due to restless leg-severe, Try Gabapentin. Presently on nocturnal O2 therapy. Labwork  Labs 05/14/2014: Total cholesterol 141, triglycerides 62, HDL 40, LDL 89, LDL particle # 1117, TSH 11.150, CMP normal Labs 08/24/2013: Total cholesterol 144, triglycerides 63, HDL 44, LDL 87. LDL particle #918. Hemoglobin  10.0/hematocrit 28.1. Macrocytic  indicis, MCV 103, MCH 36.5. Serum glucose 100 mg, BUN 24, serum creatinine 1.10, eGFR 60 mL. Labs 06/01/2013: BUN 21, serum creatinine 1.25, eGFR 51 mL TSH was minimally elevated at 4.700. Claudication, intermittent (I73.9)  Cough (R05)  Asthma (J45.909)  GERD (gastroesophageal reflux disease) (K21.9)  Asymptomatic bilateral carotid artery stenosis (L97.67) 06/17/2013 Carotid artery duplex 07/21/2015: Stenosis in the right internal carotid artery (50-69%) and Left ICA 16-49%. Severe mixed plaque throughout. < 50% left bulb stenosis. Antegrade right vertebral artery flow. Antegrade left vertebral artery flow. Follow up in six months is appropriate if clinically indicated. Compared to 12/21/2014, no significant change. Nocturnal hypoxemia (G47.34) 06/30/2013 Office visit 05/26/2014: Schedule for sleep study with potential positive airway pressure titration. OV Note Joe Star Age 07/09/2014: Disturbed sleep due to restless leg-severe, Try Gabapentin. He is also on Home O2 Nocturnal oximetry 04/12/2014: Time < 89%: 51.5 minutes, low SPO2 74%, total desaturation events: 8. This patient qualifies for nocturnal oxygen per Medicare's Group 1.  Allergies Joe Soto; Aug 25, 2015 2:33 PM) Darvon *ANALGESICS - OPIOID*  Nausea. Lisinopril *ANTIHYPERTENSIVES* 07/25/2015 06:04 PM Cough. Lipitor *ANTIHYPERLIPIDEMICS* 07/25/2015 06:04 PM Myalgia  Family History Joe Soto; 08/25/15 2:33 PM) Mother  Deceased. at age 10, from Emphysema; Known HTN Father  Deceased. at age 11, from Heart Attack; Known HTN Siblings  1 sister, 5 years younger  Social History Joe Soto; 2015-08-25 2:33 PM) Current tobacco use  Former smoker. quit 1950 Alcohol Use  Occasional alcohol use. Marital status  Married. Number of Children  2. Living Situation  Lives with spouse.  Past Surgical History Joe Soto; Aug 25, 2015 2:33 PM) Knee Replacement, Total   Right. 2014  Medication History Joe Soto; 08/25/15 2:43 PM) Valsartan (160MG Tablet, 1 (one) Tablet Oral daily, Taken starting 07/25/2015) Active. (Discontinue Lisinopril - cough) Atenolol (25MG Tablet, 1 (one) Tablet Oral daily, Taken starting 07/25/2015) Active. Aspirin (325MG Tablet, 1 (one) Tablet Tablet Tablet Oral daily, Taken starting 07/12/2014) Active. Nitrostat (0.4MG Tab Sublingual, 1 (one) Tab Sublingual Tab Sub Sublingual every 5 minutes as needed for chest pain., Taken starting 05/28/2013) Active. Advair HFA (230-21MCG/ACT Aerosol, 2 puffs Inhalation two times daily only as needed) Active. Calcium 600 plus Vitamin D3 400 iu (Free Text)  (1 weekly) Active. (Pt does not take consistantly) Proventil HFA (108 (90 Base)MCG/ACT Aerosol Soln, 1 puff Inhalation as needed) Active. Medications Reconciled (verbally)  Diagnostic Studies History Joe Soto; 08/25/15 2:35 PM) Nuclear stress test 08/01/2015 1. Resting EKG demonstrates normal sinus rhythm, left axis deviation, left anterior fascicular block. Poor R-wave progression, IRBBB. Stress EKG was positive for myocardial ischemia with frequent PVCs, ventricular couplets, PACs. Patient developed right bundle branch block with exercise. In addition there was ST segment depression of 1 mm with T-wave inversion noted at 3 minutes into recovery. The patient performed treadmill exercise using a Bruce protocol, completing 3:20 minutes and achieved 4.64 METS and 98% of the MPHR. Normal BP response. Stress symptoms included dyspnea. 2. The perfusion imaging study demonstrates the left ventricle to be dilated both impressed and more so in stress images, LV end-diastolic pressure 341 mL. There is a moderate sized severe ischemia in the inferior and inferolateral wall extending from the base towards the mid ventricle. In addition there is a small sized anterolateral nontransmural myocardial infarction with moderate peri-infarct  ischemia. Left ventricular systolic function calculated by QGS was markedly depressed at 28% with global hypokinesis and inferior and anterolateral, anteroapical hypokinesis. 3. Compared to the study done on 06/01/2013, apical lateral scar was noted history.  Inferior and infero-lateral wall ischemia is new. Patient's exercise capacity is increased from 2 minutes to the present 3 minutes and 20 seconds. EF previously was 50%. Overall this represents a high risk study. Nocturnal oxymetry:  06/30/2013: SPO2 less than 88% 27 minutes, less than 89% 28 minutes. Time considered to less than 88% 16 minutes. Total desaturation events 41. Patient qualifies for nocturnal home oxygen. Colonoscopy  normal; over 5 years ago Carotid Doppler  Carotid duplex 06/17/2013: Moderate stenosis of the bilateral distal internal carotid artery, mid internal carotid artery and proximal internal carotid artery (>50% stenosis in the range of 50-69%). Follow up in six months is appropriate if clinically indicated. Coronary Angiogram 06/15/2013 stent placed Echocardiogram 08/17/2013 1. Left ventricle cavity is normal in size. Mild concentric hypertrophy of the left ventricle. Normal global wall motion. Visual EF is 50-55%. No wall motion abnormalities. Grade I diastolic dysfunction (relaxation abnormality) with normal LA/LV end diastolic pressure. Frequent PVC and PAC noted. 2. Left atrial cavity is mildly dilated. 3. Aortic valve is tricuspid with trace regurgitation. No evidence of aortic valve stenosis. 4. Mild to moderate mitral regurgitation. Mild tricuspid regurgitation. No pulmonary hypertension. 5. Compared to 06/10/2013, EF improved from 30-35%. Anterior wall motion abnormality no longer present. 06/10/13 1. Left ventricle cavity is normal in size. Whole apex is akinetic, septum & ant. wall are hypokinetic. Severely depressed LV systolic function, visually estimated EF is 30-35%. 2. Left atrial cavity is moderately dilated. 3.  Tricuspid aortic valve with mild regurgitation. 4. Moderate mitral regurgitation. Mild calcification of the mitral valve annulus. 5. Normal tricuspid valve with mild to moderate regurgitation. Moderate pulmonary hypertension with approx. PA systolic pressure of 45 mm of Hg. 6. Small pericardial effusion. Carotid Artery Duplex 12/17/2013 Mild stenosis of the bilateral distal internal carotid artery, mid internal carotid artery and proximal internal carotid artery (<50% stenosis in the range of 16-49%). Follow up in one year is appropriate if clinically indicated. Sleep Study 04/2014 Joe Soto Joe Soto for Sleep Apnea    Review of Systems [facility] Joe Soto, Joe Soto; 08/04/2015 6:56 PM) General Present- Fatigue. Not Present- Fever and Night Sweats. Skin Not Present- Itching and Rash. HEENT Not Present- Headache. Respiratory Present- Difficulty Breathing and Wakes up from Sleep Wheezing or Short of Breath. Cardiovascular Present- Chest Pain and Claudications. Not Present- Fainting and Swelling of Extremities. Gastrointestinal Not Present- Abdominal Pain, Constipation, Diarrhea, Nausea and Vomiting. Musculoskeletal Present- Leg Weakness. Not Present- Joint Swelling. Neurological Not Present- Headaches. Hematology Not Present- Blood Clots, Easy Bruising and Nose Bleed.  Vitals Joe Soto; 08/03/2015 2:45 PM) 08/03/2015 2:38 PM Weight: 146 lb Height: 71in Body Surface Area: 1.84 m Body Mass Index: 20.36 kg/m  Pulse: 58 (Regular)  P.OX: 96% (Room air) BP: 160/60 (Sitting, Left Arm, Standard)  Physical Exam (Joe Soto, AGNP-C; 08/04/2015 6:56 PM) General Mental Status-Alert. General Appearance-Cooperative and Appears stated age. Build & Nutrition-Well built and Well nourished.  Head and Neck Thyroid Gland Characteristics - normal size and consistency and no palpable nodules.  Chest and Lung Exam Chest and lung exam reveals -quiet, even and easy respiratory  effort with no use of accessory muscles, non-tender and on auscultation, normal breath sounds, no adventitious sounds.  Cardiovascular Cardiovascular examination reveals -carotid auscultation reveals no bruits and abdominal aorta auscultation reveals no bruits and no prominent pulsation. Auscultation Rhythm - Frequent Ectopy. Heart Sounds - Normal heart sounds. Murmurs & Other Heart Sounds: Murmur - Location - Aortic Area. Timing - Early systolic. Grade - II/VI.  Abdomen  Palpation/Percussion Normal exam - Non Tender and No hepatosplenomegaly.  Peripheral Vascular Lower Extremity Inspection - Bilateral - Inspection Normal. Palpation - Edema - Left - 2+ Pitting edema. Right - No edema. Femoral pulse - Left - 2+(prominant bruit). Right - Normal. Popliteal pulse - Left - 1+. Right - Normal. Dorsalis pedis pulse - Left - Absent. Right - 2+. Posterior tibial pulse - Bilateral - Absent. Carotid arteries - Bilateral-Soft Bruit.  Neurologic Neurologic evaluation reveals -alert and oriented x 3 with no impairment of recent or remote memory. Motor-Grossly intact without any focal deficits.  Musculoskeletal Global Assessment Left Lower Extremity - no deformities, masses or tenderness, no known fractures. Right Lower Extremity - no deformities, masses or tenderness, no known fractures.  Assessment & Plan (Joe Soto AGNP-C; 08/04/2015 6:54 PM) Atherosclerosis of native coronary artery of native heart with angina pectoris (I25.119) Story: 06/15/2013: PTCA and stenting of the proximal LAD with implantation of a 2.5 x 18 mm Xience Alpine DES. Mild disease in the other vessels, diffusely diseased LAD.  Echocardiogram 08/03/2015: 1. Left ventricle cavity is mildly enlarged at 5.7 cm. Mild concentric hypertrophy of the left ventricle. Severe diffuse hypokinesis and inferior and inferolateral hypokinesis. Doppler evidence of grade II (pseudonormal) diastolic dysfunction. Diastolic dysfunction  findings suggests elevated LA/LV end diastolic pressure. Calculated EF 25%. 2. Left atrial cavity is moderately dilated at 4.7 cm. 3. Mild (Grade I) aortic regurgitation. 4. Moderate to severe posteriorly idrected mitral regurgitation, consider papillary muscle dysfunction. 5. Moderate tricuspid regurgitation. Severe pulmonary hypertension. Pulmonary artery systolic pressure is estimated at 66 mm Hg. 6. Mild to moderate pulmonic regurgitation. 7. IVC is dilated with blunted respiratory response. 8. Compared to the study done on 03/22/2013, EF decreased from 55%. Abnormal Echocardiogram.   Exercise myoview stress 08/01/2015: 1. Resting EKG demonstrates normal sinus rhythm, left axis deviation, left anterior fascicular block.  Poor R-wave progression, IRBBB. Stress EKG was positive for myocardial ischemia with frequent PVCs, ventricular couplets, PACs.  Patient developed right bundle branch block with exercise.  In addition there was ST segment depression of 1 mm with T-wave inversion noted at 3 minutes into recovery. The patient performed treadmill exercise using a Bruce protocol, completing 3:20 minutes and achieved 4.64 METS and 98% of the MPHR. Normal BP response. Stress symptoms included dyspnea. 2. The perfusion imaging study demonstrates the left ventricle to be dilated both impressed and more so in stress images, LV end-diastolic pressure 621 mL.  There is a moderate sized severe ischemia in the inferior and inferolateral wall extending from the base towards the mid ventricle.  In addition there is a small sized anterolateral nontransmural myocardial infarction with moderate peri-infarct ischemia.  Left ventricular systolic function calculated by QGS was markedly depressed at 28% with global hypokinesis and inferior and anterolateral, anteroapical hypokinesis.  3. Compared to the study done on 06/01/2013, apical lateral scar was noted history.  Inferior and infero-lateral wall ischemia is new.   Patient's exercise capacity is increased from 2 minutes to the present 3 minutes and 20 seconds.  EF previously was 50%.  Overall this represents a high risk study.  Impression: EKG 07/25/2015: Normal sinus rhythm at rate of 75 bpm, left axis deviation, left anterior fascicular block. Right bundle branch block. PVCs. Prominent S waves lateral leads, cannot exclude RVH. No significant change from EKG 01/21/2015. Current Plans Started Crestor 5MG, 1 (one) Tablet daily, #30, 08/03/2015, Ref. x1. Shortness of breath (R06.02) Future Plans Claudication, intermittent (I73.9) Future Plans 08/10/2015: LE arterial dopplers (30865) -  one time Cough (R05) Essential hypertension, benign (I10)  Labwork  Labs 08/04/2015: Vitamin D 41.6, TSH elevated at 6.880. FT3 total T4 normal.  HB 10.2/HCT 30.7, microcytic indicis.  Platelets 255.  Total cholesterol 154, triglycerides 36, HDL 53, LDL 94.  LDL particles atypical and 18.  BUN 24, serum creatinine 1.18, eGFR 54 mL.  Potassium 4.4.  Pro time 12.3, within normal limits.    Labs 05/14/2014: Total cholesterol 141, triglycerides 62, HDL 40, LDL 89, LDL particle # 1117, TSH 11.150, CMP normal  Labs 05/14/2014: Total cholesterol 141, triglycerides 62, HDL 40, LDL 89, LDL particle # 1117, TSH 11.150, CMP normal  Labs 08/24/2013: Total cholesterol 144, triglycerides 63, HDL 44, LDL 87. LDL particle #918. Hemoglobin 10.0/hematocrit 28.1. Macrocytic indicis, MCV 103, MCH 36.5. Serum glucose 100 mg, BUN 24, serum creatinine 1.10, eGFR 60 mL.  Labs 06/01/2013: BUN 21, serum creatinine 1.25, eGFR 51 mL TSH was minimally elevated at 4.700. Hyperlipidemia, mild (E78.5) Current Plans LIPOPROTEIN, BLD, BY NMR (69678) Asymptomatic bilateral carotid artery stenosis (L38.10) Story: Carotid artery duplex 07/21/2015: Stenosis in the right internal carotid artery (50-69%) and Left ICA 16-49%. Severe mixed plaque throughout. < 50% left bulb stenosis. Antegrade right vertebral artery  flow. Antegrade left vertebral artery flow. Follow up in six months is appropriate if clinically indicated. Compared to 12/21/2014, no significant change. Future Plans 01/18/2016: Duplex scan bilateral carotid arteries (17510) - one time Malaise and fatigue (R53.81)   Current Plans Mechanism of underlying disease process and action of medications discussed with the patient. I discussed primary/secondary prevention. He had been scheduled for a nuclear stress test to evaluate for progression of CAD due to symptoms which revealed a dilated LV, both at rest and more so at stress, with a moderate size severe ischemia in the inferior and inferolateral wall extending from the base toward the mid ventricle with a small-sized anterolateral nontransmural infarction with moderate peri-infarct ischemia and depressed EF 28% with global hypokinesis. Patient was brought back to discuss these test results in person to ensure he understood. Would recommend proceeding with coronary angiogram for further evaluation of coronary anatomy and possible PCI. We discussed regarding risks, benefits, alternatives to this including CTA and continued medical therapy. Patient wants to proceed. Understands <1-2% risk of death, stroke, MI, urgent CABG, bleeding, infection, renal failure but not limited to these. I have also added a very low-dose of Crestor. Patient previously unable to tolerate atorvastatin and will increase dose of Crestor as tolerated. We'll obtain a lipid panel with precatheterization labs. Patient is still very uncertain about his medications. He was given a printout of medications that he is supposed to be taking and instructed to compare this to the medications that he is currently taking at home, or he can also return and bring all of his medications and we can ensure that he is taking the appropriate medications and doses at the appropriate time. All questions answered to his and his wife's satisfaction. Follow-up  after catheterization for reevaluation and further recommendations.  Signed by Joe Soto, AGNP-C (08/04/2015 6:56 PM)

## 2015-08-23 NOTE — Interval H&P Note (Signed)
History and Physical Interval Note:  08/23/2015 11:00 AM  Joe Soto  has presented today for surgery, with the diagnosis of cad  The various methods of treatment have been discussed with the patient and family. After consideration of risks, benefits and other options for treatment, the patient has consented to  Procedure(s): Coronary Stent Intervention (N/A) as a surgical intervention .  The patient's history has been reviewed, patient examined, no change in status, stable for surgery.  I have reviewed the patient's chart and labs.  Questions were answered to the patient's satisfaction.   Please see AUS from prior admission/cathCath Lab Visit (complete for each Cath Lab visit)  Clinical Evaluation Leading to the Procedure:   ACS: No.  Non-ACS:    Anginal Classification: CCS III  Anti-ischemic medical therapy: Maximal Therapy (2 or more classes of medications)  Non-Invasive Test Results: High-risk stress test findings: cardiac mortality >3%/year  Prior CABG: No previous CABG        Adrian Prows

## 2015-08-23 NOTE — Care Management Obs Status (Signed)
MEDICARE OBSERVATION STATUS NOTIFICATION   Patient Details  Name: BRENDT OCON MRN: [id number] Date of Birth: 01-20-1923   Medicare Observation Status Notification Given:       Zenon Mayo, RN 08/23/2015, 3:51 PM

## 2015-08-24 DIAGNOSIS — Z955 Presence of coronary angioplasty implant and graft: Secondary | ICD-10-CM | POA: Diagnosis not present

## 2015-08-24 DIAGNOSIS — I25119 Atherosclerotic heart disease of native coronary artery with unspecified angina pectoris: Secondary | ICD-10-CM | POA: Diagnosis not present

## 2015-08-24 DIAGNOSIS — I2584 Coronary atherosclerosis due to calcified coronary lesion: Secondary | ICD-10-CM | POA: Diagnosis not present

## 2015-08-24 DIAGNOSIS — E785 Hyperlipidemia, unspecified: Secondary | ICD-10-CM | POA: Diagnosis not present

## 2015-08-24 LAB — BASIC METABOLIC PANEL
ANION GAP: 7 (ref 5–15)
BUN: 24 mg/dL — AB (ref 6–20)
CHLORIDE: 109 mmol/L (ref 101–111)
CO2: 24 mmol/L (ref 22–32)
Calcium: 8.8 mg/dL — ABNORMAL LOW (ref 8.9–10.3)
Creatinine, Ser: 1.15 mg/dL (ref 0.61–1.24)
GFR calc Af Amer: >60 mL/min (ref >60)
GFR calc non Af Amer: 54 mL/min — ABNORMAL LOW (ref >60)
Glucose, Bld: 121 mg/dL — ABNORMAL HIGH (ref 65–99)
POTASSIUM: 4.3 mmol/L (ref 3.5–5.1)
SODIUM: 140 mmol/L (ref 135–145)

## 2015-08-24 LAB — CBC
HEMATOCRIT: 26.4 % — AB (ref 39.0–52.0)
HEMOGLOBIN: 8.8 g/dL — AB (ref 13.0–17.0)
MCH: 36.2 pg — ABNORMAL HIGH (ref 26.0–34.0)
MCHC: 33.3 g/dL (ref 30.0–36.0)
MCV: 108.6 fL — AB (ref 78.0–100.0)
Platelets: 185 10*3/uL (ref 150–400)
RBC: 2.43 MIL/uL — AB (ref 4.22–5.81)
RDW: 14.4 % (ref 11.5–15.5)
WBC: 6.8 10*3/uL (ref 4.0–10.5)

## 2015-08-24 MED ORDER — ASPIRIN 81 MG PO CHEW: 81.0000 mg | CHEWABLE_TABLET | Freq: Every day | ORAL | 11 refills | Status: AC

## 2015-08-24 MED ORDER — SODIUM CHLORIDE 0.9 % IV SOLN: 250.0000 mL | INTRAVENOUS | Status: DC | PRN

## 2015-08-24 MED ORDER — SODIUM CHLORIDE 0.9% FLUSH: 3.0000 mL | Freq: Two times a day (BID) | INTRAVENOUS | Status: DC

## 2015-08-24 MED ORDER — SODIUM CHLORIDE 0.9% FLUSH: 3.0000 mL | INTRAVENOUS | Status: DC | PRN

## 2015-08-24 NOTE — Discharge Instructions (Signed)
Coronary Angiogram With Stent, Care After °Refer to this sheet in the next few weeks. These instructions provide you with information about caring for yourself after your procedure. Your health care provider may also give you more specific instructions. Your treatment has been planned according to current medical practices, but problems sometimes occur. Call your health care provider if you have any problems or questions after your procedure. °WHAT TO EXPECT AFTER THE PROCEDURE  °After your procedure, it is typical to have the following: °· Bruising at the catheter insertion site that usually fades within 1-2 weeks. °· Blood collecting in the tissue (hematoma) that may be painful to the touch. It should usually decrease in size and tenderness within 1-2 weeks. °HOME CARE INSTRUCTIONS °· Take medicines only as directed by your health care provider. Blood thinners may be prescribed after your procedure to improve blood flow through the stent. °· You may shower 24-48 hours after the procedure or as directed by your health care provider. Remove the bandage (dressing) and gently wash the catheter insertion site with plain soap and water. Pat the area dry with a clean towel. Do not rub the site, because this may cause bleeding. °· Do not take baths, swim, or use a hot tub until your health care provider approves. °· Check your catheter insertion site every day for redness, swelling, or drainage. °· Do not apply powder or lotion to the site. °· Do not lift over 10 lb (4.5 kg) for 5 days after your procedure or as directed by your health care provider. °· Ask your health care provider when it is okay to: °¨ Return to work or school. °¨ Resume usual physical activities or sports. °¨ Resume sexual activity. °· Eat a heart-healthy diet. This should include plenty of fresh fruits and vegetables. Meat should be lean cuts. Avoid the following types of food: °¨ Food that is high in salt. °¨ Canned or highly processed food. °¨ Food  that is high in saturated fat or sugar. °¨ Fried food. °· Make any other lifestyle changes as recommended by your health care provider. These may include: °¨ Not using any tobacco products, including cigarettes, chewing tobacco, or electronic cigarettes. If you need help quitting, ask your health care provider. °¨ Managing your weight. °¨ Getting regular exercise. °¨ Managing your blood pressure. °¨ Limiting your alcohol intake. °¨ Managing other health problems, such as diabetes. °· If you need an MRI after your heart stent has been placed, be sure to tell the health care provider who orders the MRI that you have a heart stent. °· Keep all follow-up visits as directed by your health care provider. This is important. °SEEK MEDICAL CARE IF: °· You have a fever. °· You have chills. °· You have increased bleeding from the catheter insertion site. Hold pressure on the site. °SEEK IMMEDIATE MEDICAL CARE IF: °· You develop chest pain or shortness of breath, feel faint, or pass out. °· You have unusual pain at the catheter insertion site. °· You have redness, warmth, or swelling at the catheter insertion site. °· You have drainage (other than a small amount of blood on the dressing) from the catheter insertion site. °· The catheter insertion site is bleeding, and the bleeding does not stop after 30 minutes of holding steady pressure on the site. °· You develop bleeding from any other place, such as from your rectum. There may be bright red blood in your urine or stool, or it may appear as black, tarry stool. °  °  This information is not intended to replace advice given to you by your health care provider. Make sure you discuss any questions you have with your health care provider. °  °Document Released: 07/21/2004 Document Revised: 01/22/2014 Document Reviewed: 05/26/2012 °Elsevier Interactive Patient Education ©2016 [employer] ° °

## 2015-08-24 NOTE — Progress Notes (Signed)
Pt with discharge order. Family at bedside. Discharge instructions, Dr appt , meds instructions provided. V/S stable. Radial site care instructions given. Walked with rehab without difficulty & complications. No c/o of any chest pain or discomfort. Discharged at 10:45.

## 2015-08-24 NOTE — Discharge Summary (Signed)
Physician Discharge Summary  Patient ID: Joe Soto MRN: [id number] DOB/AGE: [birth year] 82 y.o.  Admit date: 08/23/2015 Discharge date: 08/24/2015  Discharge Diagnoses: 1. CAD with stable angina 2. S/P PTCA and stenting of the proximal LAD with implantation of a 2.5 x 18 mm Xience Alpine DES on 06/15/2013; PTCA and stenting of mid LAD with a 2.5x38 mm Synergy stent on 08/23/2015 3. Bilateral carotid artery stenosis; asymptomatic 4. Hyperlipidemia 5. Essential HTN with CKD stage III  Significant Diagnostic Studies: Coronary Angiogram 08/23/2015: 1. Mid RCA lesion, 80 %stenosed. RPDA-1 lesion, 95 %stenosed. RPDA-2 lesion, 99 %stenosed 2. Mid LAD lesion, 80 %stenosed. A STENT SYNERGY DES 2.5X38 drug eluting stent was successfully placed, and does not overlap previously placed stent. Post intervention, there is a 0% residual stenosis.  Hospital Course: Joe Soto is a 82 y.o. male with history of hypertension, hyperlipidemia, known coronary artery disease and history of PTCA and stenting of the proximal LAD with implantation of a 2.5 x 18 mm Xience DES on 06/15/2013, since then EF improved from 30% to low normal. He underwent coronary angiography on 08/09/2015, revealing a long segment 80% stenosis in the mid LAD distal to the previously placed stent. He also was found to have a 80% stenosis in the distal RCA and tandem 90-99% stenosis in a large PDA branch of the right coronary artery. He was scheduled for elective angioplasty to his LAD followed by staged intervention to complex calcified distal RCA at a later date. He underwent successful PTCA and stenting of the mid LAD with implantation of a 2.5 x 38 mm Synergy DES, stenosis reduced from 80% to 0%. No chest pain today. Ambulating in room and feels well.   Recommendations on discharge: He has a 80% stenosis in the distal RCA and tandem 90-99% stenosis in a large PDA branch of the right coronary artery. He will need staged intervention to complex  calcified distal RCA at a later date. He will need atherectomy to the RCA due to severe calcification. He is already on appropriate medical therapy.   Discharge Exam: Blood pressure (!) 121/92, pulse (!) 57, temperature 98.1 F (36.7 C), temperature source Oral, resp. rate 15, height 5\' 11"  (1.803 m), weight 62.4 kg (137 lb 9.1 oz), SpO2 99 %.    General appearance: alert, cooperative and no distress Neck: no adenopathy, no JVD, supple, symmetrical, trachea midline, thyroid not enlarged, symmetric, no tenderness/mass/nodules and soft bilateral bruit Resp: clear to auscultation bilaterally Cardio: S1, S2 normal and II/VI SEM at RUSB Extremities: chronic bilateral LE edema Pulses: Left lower extremity pulses reduced, RLE normal. Right radial access site asymptomatic. Skin: Skin color, texture, turgor normal. No rashes or lesions  Labs:   Lab Results  Component Value Date   WBC 6.8 08/24/2015   HGB 8.8 (L) 08/24/2015   HCT 26.4 (L) 08/24/2015   MCV 108.6 (H) 08/24/2015   PLT 185 08/24/2015    Recent Labs Lab 08/24/15 0422  NA 140  K 4.3  CL 109  CO2 24  BUN 24*  CREATININE 1.15  CALCIUM 8.8*  GLUCOSE 121*    Lipid Panel  No results found for: CHOL, TRIG, HDL, CHOLHDL, VLDL, LDLCALC  BNP (last 3 results) No results for input(s): BNP in the last 8760 hours.  HEMOGLOBIN A1C No results found for: HGBA1C, MPG  Cardiac Panel (last 3 results) No results for input(s): CKTOTAL, CKMB, TROPONINI, RELINDX in the last 8760 hours.  No results found for: CKTOTAL, CKMB, CKMBINDEX, TROPONINI  TSH No results for input(s): TSH in the last 8760 hours.  EKG 08/24/2015: Sinus rhythm at a rate of 64 bpm, left axis deviation, left anterior fascicular block, right bundle branch block.  PVCs/PACs.  Persistence of S waves suggests RVH.  No change from outpatient EKG on 07/25/2015.  Radiology: No results found.    FOLLOW UP PLANS AND APPOINTMENTS Discharge Instructions    AMB Referral  to Cardiac Rehabilitation - Phase II    Complete by:  As directed   Diagnosis:  Coronary Stents       Medication List    TAKE these medications   aspirin 81 MG chewable tablet Chew 1 tablet (81 mg total) by mouth daily.   atenolol 25 MG tablet Commonly known as:  TENORMIN Take 25 mg by mouth daily.   CALCIUM 600+D 600-400 MG-UNIT tablet Generic drug:  Calcium Carbonate-Vitamin D Take 2 tablets by mouth daily.   clopidogrel 75 MG tablet Commonly known as:  PLAVIX Take 1 tablet (75 mg total) by mouth daily.   fluticasone-salmeterol 230-21 MCG/ACT inhaler Commonly known as:  ADVAIR HFA Inhale 2 puffs into the lungs 2 (two) times daily.   furosemide 20 MG tablet Commonly known as:  LASIX Take 1 tablet (20 mg total) by mouth daily.   hydrALAZINE 25 MG tablet Commonly known as:  APRESOLINE Take 1 tablet (25 mg total) by mouth 3 (three) times daily.   levothyroxine 75 MCG tablet Commonly known as:  SYNTHROID, LEVOTHROID Take 75 mcg by mouth daily.   LIPOGEN SG PO Take 1 tablet by mouth daily.   multivitamin with minerals Tabs tablet Take 1 tablet by mouth daily.   nitroGLYCERIN 0.4 MG SL tablet Commonly known as:  NITROSTAT Place 0.4 mg under the tongue every 5 (five) minutes as needed for chest pain.   potassium chloride 10 MEQ tablet Commonly known as:  K-DUR Take 1 tablet (10 mEq total) by mouth daily.   PROVENTIL HFA 108 (90 Base) MCG/ACT inhaler Generic drug:  albuterol USE 2 PUFFS EVERY 4-6 HOURS AS NEEDED FOR ASHTMA FLAREUP.   albuterol 108 (90 Base) MCG/ACT inhaler Commonly known as:  PROVENTIL HFA;VENTOLIN HFA Take 2 puffs every 4 to 6 hours as needed   valsartan 160 MG tablet Commonly known as:  DIOVAN Take 160 mg by mouth daily.      Follow-up Information    Adrian Prows, MD Follow up on 09/02/2015.   Specialty:  Cardiology Why:  at 12:00pm Contact information: [address] [place] [address] [phone]             Rachel Bo, NP-C 08/24/2015, 8:10 AM [facility]  Pager: [phone] Office: [phone]

## 2015-08-24 NOTE — Progress Notes (Signed)
CARDIAC REHAB PHASE I   PRE:  Rate/Rhythm: 47 SB  BP:  Sitting: 141/39        SaO2: 98 RA  MODE:  Ambulation: 500 ft   POST:  Rate/Rhythm: 79 bigeminy  BP:  Sitting: 141/78         SaO2: 94 RA  Pt ambulated 500 ft on RA, handheld assist, steady gait, tolerated well with no complaints. Completed PCI/stent education with pt, pt's wife and daughter at bedside.  Reviewed risk factors, anti-platelet therapy, stent card, activity restrictions, ntg, exercise, heart healthy diet, sodium restrictions, CHF booklet and zone tool, daily weights and phase 2 cardiac rehab. Pt verbalized understanding, receptive to education. Pt agrees to phase 2 cardiac rehab referral, however will delay sending referral as pt is to return for staged PCI next week. Will send referral when pt returns.  Pt to chair after walk, call bell within reach, eager for discharge.  [id number] Lenna Sciara, RN, BSN 08/24/2015 10:06 AM

## 2015-08-24 NOTE — Care Management Note (Signed)
Case Management Note  Patient Details  Name: Joe Soto MRN: [id number] Date of Birth: [birth year]  Subjective/Objective:     Patient is s/p intervention, on plavix already, for discharge today, no needs.               Action/Plan:   Expected Discharge Date:                  Expected Discharge Plan:  Home/Self Care  In-House Referral:     Discharge planning Services  CM Consult  Post Acute Care Choice:    Choice offered to:     DME Arranged:    DME Agency:     HH Arranged:    HH Agency:     Status of Service:  Completed, signed off  If discussed at [employer] of Stay Meetings, dates discussed:    Additional Comments:  Zenon Mayo, RN 08/24/2015, 8:51 AM

## 2015-09-13 ENCOUNTER — Ambulatory Visit (HOSPITAL_COMMUNITY)
Admission: RE | Admit: 2015-09-13 | Discharge: 2015-09-14 | Disposition: A | Discharge status: Home / Self Care | Payer: Medicare Other | Source: Ambulatory Visit | Attending: Cardiology | Admitting: Cardiology

## 2015-09-13 ENCOUNTER — Encounter (HOSPITAL_COMMUNITY): Payer: Self-pay | Admitting: Cardiology

## 2015-09-13 ENCOUNTER — Encounter (HOSPITAL_COMMUNITY)
Admission: RE | Disposition: A | Discharge status: Home / Self Care | Payer: Self-pay | Source: Ambulatory Visit | Attending: Cardiology

## 2015-09-13 ENCOUNTER — Ambulatory Visit (HOSPITAL_COMMUNITY): Payer: Medicare Other

## 2015-09-13 DIAGNOSIS — I2584 Coronary atherosclerosis due to calcified coronary lesion: Secondary | ICD-10-CM | POA: Diagnosis not present

## 2015-09-13 DIAGNOSIS — G2581 Restless legs syndrome: Secondary | ICD-10-CM | POA: Diagnosis not present

## 2015-09-13 DIAGNOSIS — I25119 Atherosclerotic heart disease of native coronary artery with unspecified angina pectoris: Secondary | ICD-10-CM | POA: Insufficient documentation

## 2015-09-13 DIAGNOSIS — D649 Anemia, unspecified: Secondary | ICD-10-CM | POA: Diagnosis not present

## 2015-09-13 DIAGNOSIS — Z950 Presence of cardiac pacemaker: Secondary | ICD-10-CM | POA: Diagnosis not present

## 2015-09-13 DIAGNOSIS — I130 Hypertensive heart and chronic kidney disease with heart failure and stage 1 through stage 4 chronic kidney disease, or unspecified chronic kidney disease: Secondary | ICD-10-CM | POA: Insufficient documentation

## 2015-09-13 DIAGNOSIS — Z87891 Personal history of nicotine dependence: Secondary | ICD-10-CM | POA: Diagnosis not present

## 2015-09-13 DIAGNOSIS — Z7982 Long term (current) use of aspirin: Secondary | ICD-10-CM | POA: Insufficient documentation

## 2015-09-13 DIAGNOSIS — E785 Hyperlipidemia, unspecified: Secondary | ICD-10-CM | POA: Diagnosis not present

## 2015-09-13 DIAGNOSIS — I5042 Chronic combined systolic (congestive) and diastolic (congestive) heart failure: Secondary | ICD-10-CM | POA: Diagnosis not present

## 2015-09-13 DIAGNOSIS — I255 Ischemic cardiomyopathy: Secondary | ICD-10-CM | POA: Insufficient documentation

## 2015-09-13 DIAGNOSIS — N183 Chronic kidney disease, stage 3 (moderate): Secondary | ICD-10-CM | POA: Diagnosis not present

## 2015-09-13 DIAGNOSIS — Z96651 Presence of right artificial knee joint: Secondary | ICD-10-CM | POA: Diagnosis not present

## 2015-09-13 DIAGNOSIS — Z9861 Coronary angioplasty status: Secondary | ICD-10-CM

## 2015-09-13 DIAGNOSIS — I2510 Atherosclerotic heart disease of native coronary artery without angina pectoris: Secondary | ICD-10-CM | POA: Diagnosis present

## 2015-09-13 DIAGNOSIS — Z7902 Long term (current) use of antithrombotics/antiplatelets: Secondary | ICD-10-CM | POA: Insufficient documentation

## 2015-09-13 DIAGNOSIS — Z955 Presence of coronary angioplasty implant and graft: Secondary | ICD-10-CM | POA: Diagnosis not present

## 2015-09-13 HISTORY — DX: Family history of other specified conditions: Z84.89

## 2015-09-13 HISTORY — DX: Nausea with vomiting, unspecified: R11.2

## 2015-09-13 HISTORY — DX: Other specified postprocedural states: Z98.890

## 2015-09-13 HISTORY — PX: CARDIAC CATHETERIZATION: SHX172

## 2015-09-13 LAB — POCT ACTIVATED CLOTTING TIME
ACTIVATED CLOTTING TIME: 197 s
ACTIVATED CLOTTING TIME: 213 s
ACTIVATED CLOTTING TIME: 241 s
Activated Clotting Time: 230 seconds
Activated Clotting Time: 235 seconds
Activated Clotting Time: 235 seconds
Activated Clotting Time: 169 seconds

## 2015-09-13 LAB — ECHOCARDIOGRAM LIMITED
HEIGHTINCHES: 71 in
WEIGHTICAEL: 2224 [oz_av]

## 2015-09-13 SURGERY — CORONARY ATHERECTOMY
Anesthesia: LOCAL

## 2015-09-13 MED ORDER — SODIUM CHLORIDE 0.9 % IV SOLN: 250.0000 mL | INTRAVENOUS | Status: DC | PRN

## 2015-09-13 MED ORDER — IOPAMIDOL (ISOVUE-370) INJECTION 76%
INTRAVENOUS | Status: DC | PRN
  Administered 2015-09-13: 215 mL via INTRA_ARTERIAL

## 2015-09-13 MED ORDER — FENTANYL CITRATE (PF) 100 MCG/2ML IJ SOLN
INTRAMUSCULAR | Status: DC | PRN
  Administered 2015-09-13 (×4): 25 ug via INTRAVENOUS

## 2015-09-13 MED ORDER — HYDRALAZINE HCL 25 MG PO TABS
25.0000 mg | ORAL_TABLET | Freq: Three times a day (TID) | ORAL | Status: DC
  Administered 2015-09-13 – 2015-09-14 (×3): 25 mg via ORAL
  Filled 2015-09-13 (×3): qty 1

## 2015-09-13 MED ORDER — CLOPIDOGREL BISULFATE 75 MG PO TABS
ORAL_TABLET | ORAL | Status: AC
  Administered 2015-09-13: 75 mg via ORAL
  Filled 2015-09-13: qty 1

## 2015-09-13 MED ORDER — ATROPINE SULFATE 1 MG/10ML IJ SOSY
PREFILLED_SYRINGE | INTRAMUSCULAR | Status: AC
  Filled 2015-09-13: qty 10

## 2015-09-13 MED ORDER — MOMETASONE FURO-FORMOTEROL FUM 200-5 MCG/ACT IN AERO
2.0000 | INHALATION_SPRAY | Freq: Two times a day (BID) | RESPIRATORY_TRACT | Status: DC
  Administered 2015-09-13: 13:00:00 2 via RESPIRATORY_TRACT
  Filled 2015-09-13: qty 8.8

## 2015-09-13 MED ORDER — MIDAZOLAM HCL 2 MG/2ML IJ SOLN
INTRAMUSCULAR | Status: AC
  Filled 2015-09-13: qty 2

## 2015-09-13 MED ORDER — FENTANYL CITRATE (PF) 100 MCG/2ML IJ SOLN
50.0000 ug | INTRAMUSCULAR | Status: DC | PRN
  Filled 2015-09-13: qty 2

## 2015-09-13 MED ORDER — ALBUTEROL SULFATE (2.5 MG/3ML) 0.083% IN NEBU: 2.5000 mg | INHALATION_SOLUTION | Freq: Four times a day (QID) | RESPIRATORY_TRACT | Status: DC | PRN

## 2015-09-13 MED ORDER — ALBUTEROL SULFATE HFA 108 (90 BASE) MCG/ACT IN AERS: 1.0000 | INHALATION_SPRAY | Freq: Four times a day (QID) | RESPIRATORY_TRACT | Status: DC | PRN

## 2015-09-13 MED ORDER — CLOPIDOGREL BISULFATE 75 MG PO TABS
75.0000 mg | ORAL_TABLET | Freq: Every day | ORAL | Status: DC
  Administered 2015-09-14: 09:00:00 75 mg via ORAL
  Filled 2015-09-13: qty 1

## 2015-09-13 MED ORDER — FAMOTIDINE IN NACL 20-0.9 MG/50ML-% IV SOLN
20.0000 mg | Freq: Once | INTRAVENOUS | Status: AC
  Administered 2015-09-13: 20 mg via INTRAVENOUS
  Filled 2015-09-13: qty 50

## 2015-09-13 MED ORDER — FUROSEMIDE 20 MG PO TABS
20.0000 mg | ORAL_TABLET | Freq: Every day | ORAL | Status: DC
  Administered 2015-09-14: 20 mg via ORAL
  Filled 2015-09-13: qty 1

## 2015-09-13 MED ORDER — HEPARIN (PORCINE) IN NACL 2-0.9 UNIT/ML-% IJ SOLN
INTRAMUSCULAR | Status: DC | PRN
  Administered 2015-09-13: 2000 mL

## 2015-09-13 MED ORDER — HEPARIN (PORCINE) IN NACL 2-0.9 UNIT/ML-% IJ SOLN
INTRAMUSCULAR | Status: AC
  Filled 2015-09-13: qty 1500

## 2015-09-13 MED ORDER — LEVOTHYROXINE SODIUM 75 MCG PO TABS
75.0000 ug | ORAL_TABLET | Freq: Every day | ORAL | Status: DC
  Administered 2015-09-13 – 2015-09-14 (×2): 75 ug via ORAL
  Filled 2015-09-13 (×2): qty 1

## 2015-09-13 MED ORDER — POTASSIUM CHLORIDE ER 10 MEQ PO TBCR
10.0000 meq | EXTENDED_RELEASE_TABLET | Freq: Every day | ORAL | Status: DC
  Administered 2015-09-14: 10 meq via ORAL
  Filled 2015-09-13 (×2): qty 1

## 2015-09-13 MED ORDER — ACETAMINOPHEN 325 MG PO TABS
650.0000 mg | ORAL_TABLET | ORAL | Status: DC | PRN
  Administered 2015-09-13: 13:00:00 650 mg via ORAL
  Filled 2015-09-13: qty 2

## 2015-09-13 MED ORDER — NITROGLYCERIN 1 MG/10 ML FOR IR/CATH LAB
INTRA_ARTERIAL | Status: AC
  Filled 2015-09-13: qty 10

## 2015-09-13 MED ORDER — SODIUM CHLORIDE 0.9 % IV SOLN
INTRAVENOUS | Status: DC | PRN
  Administered 2015-09-13: 10 mL/h via INTRAVENOUS

## 2015-09-13 MED ORDER — ONDANSETRON HCL 4 MG/2ML IJ SOLN
INTRAMUSCULAR | Status: AC
  Filled 2015-09-13: qty 2

## 2015-09-13 MED ORDER — SODIUM CHLORIDE 0.9 % IV SOLN: INTRAVENOUS | Status: DC | PRN

## 2015-09-13 MED ORDER — FAMOTIDINE 40 MG/5ML PO SUSR
40.0000 mg | Freq: Two times a day (BID) | ORAL | Status: DC
  Administered 2015-09-14: 40 mg via ORAL
  Filled 2015-09-13: qty 5

## 2015-09-13 MED ORDER — NITROGLYCERIN 1 MG/10 ML FOR IR/CATH LAB
INTRA_ARTERIAL | Status: DC | PRN
  Administered 2015-09-13: 200 ug via INTRACORONARY

## 2015-09-13 MED ORDER — ROSUVASTATIN CALCIUM 10 MG PO TABS
5.0000 mg | ORAL_TABLET | Freq: Every day | ORAL | Status: DC
  Administered 2015-09-13: 5 mg via ORAL
  Filled 2015-09-13: qty 1

## 2015-09-13 MED ORDER — ONDANSETRON HCL 4 MG/2ML IJ SOLN
4.0000 mg | Freq: Four times a day (QID) | INTRAMUSCULAR | Status: DC | PRN
  Administered 2015-09-13 – 2015-09-14 (×2): 4 mg via INTRAVENOUS
  Filled 2015-09-13 (×2): qty 2

## 2015-09-13 MED ORDER — FENTANYL CITRATE (PF) 100 MCG/2ML IJ SOLN
INTRAMUSCULAR | Status: AC
  Filled 2015-09-13: qty 2

## 2015-09-13 MED ORDER — ONDANSETRON HCL 4 MG/2ML IJ SOLN
INTRAMUSCULAR | Status: DC | PRN
  Administered 2015-09-13 (×2): 4 mg via INTRAVENOUS

## 2015-09-13 MED ORDER — HEPARIN SODIUM (PORCINE) 1000 UNIT/ML IJ SOLN
INTRAMUSCULAR | Status: AC
  Filled 2015-09-13: qty 1

## 2015-09-13 MED ORDER — SODIUM CHLORIDE 0.9% FLUSH: 3.0000 mL | Freq: Two times a day (BID) | INTRAVENOUS | Status: DC

## 2015-09-13 MED ORDER — LIDOCAINE HCL (PF) 1 % IJ SOLN
INTRAMUSCULAR | Status: DC | PRN
  Administered 2015-09-13: 25 mL

## 2015-09-13 MED ORDER — MIDAZOLAM HCL 2 MG/2ML IJ SOLN
INTRAMUSCULAR | Status: DC | PRN
  Administered 2015-09-13: 1 mg via INTRAVENOUS

## 2015-09-13 MED ORDER — SODIUM CHLORIDE 0.9 % IV SOLN
INTRAVENOUS | Status: AC
  Administered 2015-09-13: 12:00:00 via INTRAVENOUS

## 2015-09-13 MED ORDER — NITROGLYCERIN 0.4 MG SL SUBL: 0.4000 mg | SUBLINGUAL_TABLET | SUBLINGUAL | Status: DC | PRN

## 2015-09-13 MED ORDER — IOPAMIDOL (ISOVUE-370) INJECTION 76%
INTRAVENOUS | Status: AC
  Filled 2015-09-13: qty 125

## 2015-09-13 MED ORDER — TRAMADOL HCL 50 MG PO TABS
50.0000 mg | ORAL_TABLET | Freq: Four times a day (QID) | ORAL | Status: DC | PRN
  Administered 2015-09-13 – 2015-09-14 (×2): 50 mg via ORAL
  Filled 2015-09-13 (×2): qty 1

## 2015-09-13 MED ORDER — VIPERSLIDE LUBRICANT OPTIME
TOPICAL | Status: DC | PRN
  Administered 2015-09-13: 08:00:00 via SURGICAL_CAVITY

## 2015-09-13 MED ORDER — DIPHENHYDRAMINE HCL 50 MG/ML IJ SOLN
12.5000 mg | Freq: Four times a day (QID) | INTRAMUSCULAR | Status: DC | PRN
  Administered 2015-09-13: 12.5 mg via INTRAVENOUS
  Filled 2015-09-13: qty 1

## 2015-09-13 MED ORDER — LEVOTHYROXINE SODIUM 75 MCG PO TABS: 75.0000 ug | ORAL_TABLET | Freq: Every day | ORAL | Status: DC

## 2015-09-13 MED ORDER — SODIUM CHLORIDE 0.9% FLUSH: 3.0000 mL | INTRAVENOUS | Status: DC | PRN

## 2015-09-13 MED ORDER — ATENOLOL 50 MG PO TABS
25.0000 mg | ORAL_TABLET | Freq: Every day | ORAL | Status: DC
  Administered 2015-09-13 – 2015-09-14 (×2): 25 mg via ORAL
  Filled 2015-09-13 (×2): qty 1

## 2015-09-13 MED ORDER — LIDOCAINE HCL (PF) 1 % IJ SOLN
INTRAMUSCULAR | Status: AC
  Filled 2015-09-13: qty 30

## 2015-09-13 MED ORDER — SODIUM CHLORIDE 0.9% FLUSH
3.0000 mL | Freq: Two times a day (BID) | INTRAVENOUS | Status: DC
  Administered 2015-09-13: 22:00:00 3 mL via INTRAVENOUS
  Administered 2015-09-13: 16:00:00 via INTRAVENOUS

## 2015-09-13 MED ORDER — CLOPIDOGREL BISULFATE 75 MG PO TABS
75.0000 mg | ORAL_TABLET | ORAL | Status: AC
Start: 2015-09-13 — End: 2015-09-13
  Administered 2015-09-13: 75 mg via ORAL

## 2015-09-13 MED ORDER — SODIUM CHLORIDE 0.9 % IV SOLN
INTRAVENOUS | Status: DC
  Administered 2015-09-13: 07:00:00 via INTRAVENOUS

## 2015-09-13 MED ORDER — ASPIRIN 81 MG PO CHEW
81.0000 mg | CHEWABLE_TABLET | ORAL | Status: AC
Start: 2015-09-13 — End: 2015-09-13
  Administered 2015-09-13: 81 mg via ORAL

## 2015-09-13 MED ORDER — IOPAMIDOL (ISOVUE-370) INJECTION 76%
INTRAVENOUS | Status: AC
  Filled 2015-09-13: qty 100

## 2015-09-13 MED ORDER — BIVALIRUDIN 250 MG IV SOLR
INTRAVENOUS | Status: AC
  Filled 2015-09-13: qty 250

## 2015-09-13 MED ORDER — ASPIRIN 81 MG PO CHEW
81.0000 mg | CHEWABLE_TABLET | Freq: Every day | ORAL | Status: DC
  Administered 2015-09-14: 81 mg via ORAL
  Filled 2015-09-13: qty 1

## 2015-09-13 MED ORDER — ASPIRIN 81 MG PO CHEW
CHEWABLE_TABLET | ORAL | Status: AC
  Administered 2015-09-13: 81 mg via ORAL
  Filled 2015-09-13: qty 1

## 2015-09-13 MED ORDER — HEPARIN SODIUM (PORCINE) 1000 UNIT/ML IJ SOLN
INTRAMUSCULAR | Status: DC | PRN
  Administered 2015-09-13: 7000 [IU] via INTRAVENOUS
  Administered 2015-09-13: 2000 [IU] via INTRAVENOUS
  Administered 2015-09-13 (×2): 3000 [IU] via INTRAVENOUS
  Administered 2015-09-13: 2000 [IU] via INTRAVENOUS
  Administered 2015-09-13: 3000 [IU] via INTRAVENOUS

## 2015-09-13 SURGICAL SUPPLY — 27 items
BALLN EMERGE MR 2.5X12 (BALLOONS) ×2
BALLN SPRINT LEG OTW 1.25X6 (BALLOONS) ×2
BALLN ~~LOC~~ EMERGE MR 2.5X12 (BALLOONS) ×2
BALLOON EMERGE MR 2.5X12 (BALLOONS) ×1 IMPLANT
BALLOON SPRINT LEG OTW 1.25X6 (BALLOONS) ×1 IMPLANT
BALLOON ~~LOC~~ EMERGE MR 2.5X12 (BALLOONS) ×1 IMPLANT
CABLE ADAPT CONN TEMP 6FT (ADAPTER) ×2 IMPLANT
CATH S G BIP PACING (SET/KITS/TRAYS/PACK) ×2 IMPLANT
CROWN DIAMONDBACK CLASSIC 1.25 (BURR) ×2 IMPLANT
ELECT DEFIB PAD ADLT CADENCE (PAD) ×2 IMPLANT
GUIDE CATH RUNWAY 6FR AR1 (CATHETERS) ×2 IMPLANT
GUIDELINER 6F (CATHETERS) ×2 IMPLANT
KIT ENCORE 26 ADVANTAGE (KITS) ×2 IMPLANT
KIT HEART LEFT (KITS) ×2 IMPLANT
LUBRICANT VIPERSLIDE CORONARY (MISCELLANEOUS) ×2 IMPLANT
PACK CARDIAC CATHETERIZATION (CUSTOM PROCEDURE TRAY) ×2 IMPLANT
SET INTRODUCER MICROPUNCT 5F (INTRODUCER) ×2 IMPLANT
SHEATH PINNACLE 5F 10CM (SHEATH) ×2 IMPLANT
SHEATH PINNACLE 6F 10CM (SHEATH) ×2 IMPLANT
STENT XIENCE ALPINE RX 2.5X28 (Permanent Stent) ×2 IMPLANT
STENT XIENCE ALPINE RX 2.5X33 (Permanent Stent) ×2 IMPLANT
TRANSDUCER W/STOPCOCK (MISCELLANEOUS) ×2 IMPLANT
TUBING CIL FLEX 10 FLL-RA (TUBING) ×2 IMPLANT
WIRE ASAHI PROWATER 180CM (WIRE) ×2 IMPLANT
WIRE EMERALD 3MM-J .035X150CM (WIRE) ×2 IMPLANT
WIRE MAILMAN 300CM (WIRE) ×2 IMPLANT
WIRE VIPER ADVANCE COR .012TIP (WIRE) ×2 IMPLANT

## 2015-09-13 NOTE — Progress Notes (Signed)
Site area: right groin (ARTERIAL )  Site Prior to Removal:  Level 0  Pressure Applied For 20 MINUTES    Minutes Beginning at 1700  Manual:   Yes.    Patient Status During Pull:  1700  Post Pull Groin Site:  Level 0  Post Pull Instructions Given:  Yes.    Post Pull Pulses Present:  Yes.    Dressing Applied:  Yes.    Comments:  TOLERATED PROCEDURE WELL

## 2015-09-13 NOTE — Care Management Note (Addendum)
Case Management Note  Patient Details  Name: Joe Soto MRN: [id number] Date of Birth: May 31, 1923  Subjective/Objective:    Patient is s/p coronary stent intervention, graft / atherectomy, and temporary pacemaker.  Will be on plavix and asa.  NCM will cont to follow for dc needs.                 Action/Plan:   Expected Discharge Date:                  Expected Discharge Plan:  Home/Self Care  In-House Referral:     Discharge planning Services     Post Acute Care Choice:    Choice offered to:     DME Arranged:    DME Agency:     HH Arranged:    HH Agency:     Status of Service:  In process, will continue to follow  If discussed at Long Length of Stay Meetings, dates discussed:    Additional Comments:  Zenon Mayo, RN 09/13/2015, 2:49 PM

## 2015-09-13 NOTE — Progress Notes (Signed)
I have met with patient's family, his wife and his son, explained to them regarding the complication associated with PCI, they're aware that the next 24 hours is crucial. Patient asymptomatic. Patient is nauseous as he was on prior angioplasty due to fentanyl and headache due to intracoronary nitroglycerin but no chest pain and feels well.

## 2015-09-13 NOTE — H&P (Signed)
History of Present Illness Joe Soto AGNP-C; 08/04/2015 6:56 PM) The patient is a 82 year old male who presents for a follow-up for Coronary artery disease. Joe Soto is a fairly active Caucasian male with very mild hyperlipidemia, coronary artery disease and fatigue. He had undergone successful angioplasty to his proximal LAD on 06/15/2013, since then EF improved from 30% to low normal. He states that since being on nocturnal oxygen and over the last 6 months he has regained all his strength and hasresumed all his activities without any limitations.  He is presently on nocturnal oxygen at night due to nocturnal hypoxemia, sleep study had revealed severe restless leg syndrome. He had felt well for 6 months, but states that in the past 2-3 months he has noticed marked increase in fatigue, mild dyspnea on exertion and occasional episodes of chest discomfort with exertion activity.   He underwent PCI to proximal LAD 3 weeks ago and now scheduled for elective PCI to the RCA. Has felt better since LAD PCI.   Allergies Joe Soto; September 02, 2015 2:33 PM) Darvon *ANALGESICS - OPIOID*  Nausea. Lisinopril *ANTIHYPERTENSIVES* 07/25/2015 06:04 PM Cough. Lipitor *ANTIHYPERLIPIDEMICS* 07/25/2015 06:04 PM Myalgia  Family History Joe Soto; 09/02/2015 2:33 PM) Mother  Deceased. at age 17, from Emphysema; Known HTN Father  Deceased. at age 41, from Heart Attack; Known HTN Siblings  1 sister, 5 years younger  Social History Joe Soto; 09-02-2015 2:33 PM) Current tobacco use  Former smoker. quit 1950 Alcohol Use  Occasional alcohol use. Marital status  Married. Number of Children  2. Living Situation  Lives with spouse.  Past Surgical History Joe Soto; Sep 02, 2015 2:33 PM) Knee Replacement, Total  Right. 2014   Blood pressure 128/79, pulse 92, temperature 97.9 F (36.6 C), temperature source Oral, resp. rate 16, height 5\' 11"  (1.803 m),  weight 63 kg (139 lb), SpO2 100 %.  Physical Exam  Constitutional: He is oriented to person, place, and time. He appears well-developed. No distress.  Eyes: Conjunctivae are normal.  Neck: Normal range of motion.  Cardiovascular: Normal rate and regular rhythm.   Murmur heard. Pulmonary/Chest: Effort normal. He has no wheezes.  Abdominal: Soft. Bowel sounds are normal.  Musculoskeletal: Normal range of motion.  Neurological: He is alert and oriented to person, place, and time.  Skin: Skin is warm.   Impression:  Coronary artery disease with class III angina pectoris and dyspnea, scheduled for elective PCI to the right coronary artery. All questions have been answered.  Cath Lab Visit (complete for each Cath Lab visit)  Clinical Evaluation Leading to the Procedure:   ACS: No.  Non-ACS:    Anginal Classification: CCS III  Anti-ischemic medical therapy: Maximal Therapy (2 or more classes of medications)  Non-Invasive Test Results: High-risk stress test findings: cardiac mortality >3%/year  Prior CABG: No previous CABG

## 2015-09-13 NOTE — Progress Notes (Signed)
  Echocardiogram 2D Echocardiogram has been performed.  Donata Clay 09/13/2015, 4:05 PM

## 2015-09-13 NOTE — Interval H&P Note (Signed)
History and Physical Interval Note:  09/13/2015 8:05 AM  Joe Soto  has presented today for surgery, with the diagnosis of Planned PCI of Distal RCA CSI  The various methods of treatment have been discussed with the patient and family. After consideration of risks, benefits and other options for treatment, the patient has consented to  Procedure(s): Coronary/Graft Atherectomy (N/A) as a surgical intervention .  The patient's history has been reviewed, patient examined, no change in status, stable for surgery.  I have reviewed the patient's chart and labs.  Questions were answered to the patient's satisfaction.     Adrian Prows

## 2015-09-13 NOTE — Progress Notes (Signed)
Site area: right groin ( VENOUS SHEATH)  Site Prior to Removal:  Level 0  Pressure Applied For 10 MINUTES    Minutes Beginning at 1710  Manual:   Yes.    Patient Status During Pull:  AAO X 4  Post Pull Groin Site:  Level 0  Post Pull Instructions Given:  Yes.    Post Pull Pulses Present:  Yes.    Dressing Applied:  Yes.    Comments:  TOLERATED PROCEDURE WELL

## 2015-09-14 ENCOUNTER — Encounter (HOSPITAL_COMMUNITY): Payer: Self-pay | Admitting: Cardiology

## 2015-09-14 DIAGNOSIS — Z955 Presence of coronary angioplasty implant and graft: Secondary | ICD-10-CM | POA: Diagnosis not present

## 2015-09-14 DIAGNOSIS — I130 Hypertensive heart and chronic kidney disease with heart failure and stage 1 through stage 4 chronic kidney disease, or unspecified chronic kidney disease: Secondary | ICD-10-CM | POA: Diagnosis not present

## 2015-09-14 DIAGNOSIS — Z950 Presence of cardiac pacemaker: Secondary | ICD-10-CM | POA: Diagnosis not present

## 2015-09-14 DIAGNOSIS — I5042 Chronic combined systolic (congestive) and diastolic (congestive) heart failure: Secondary | ICD-10-CM | POA: Diagnosis not present

## 2015-09-14 LAB — BASIC METABOLIC PANEL
Anion gap: 7 (ref 5–15)
BUN: 31 mg/dL — ABNORMAL HIGH (ref 6–20)
CALCIUM: 8.9 mg/dL (ref 8.9–10.3)
CO2: 25 mmol/L (ref 22–32)
CREATININE: 1.31 mg/dL — AB (ref 0.61–1.24)
Chloride: 110 mmol/L (ref 101–111)
GFR calc non Af Amer: 46 mL/min — ABNORMAL LOW (ref >60)
GFR, EST AFRICAN AMERICAN: 54 mL/min — AB (ref >60)
Glucose, Bld: 122 mg/dL — ABNORMAL HIGH (ref 65–99)
Potassium: 4.6 mmol/L (ref 3.5–5.1)
Sodium: 142 mmol/L (ref 135–145)

## 2015-09-14 LAB — CBC
HCT: 24.1 % — ABNORMAL LOW (ref 39.0–52.0)
Hemoglobin: 8 g/dL — ABNORMAL LOW (ref 13.0–17.0)
MCH: 36.2 pg — AB (ref 26.0–34.0)
MCHC: 33.2 g/dL (ref 30.0–36.0)
MCV: 109 fL — ABNORMAL HIGH (ref 78.0–100.0)
PLATELETS: 160 10*3/uL (ref 150–400)
RBC: 2.21 MIL/uL — AB (ref 4.22–5.81)
RDW: 14.1 % (ref 11.5–15.5)
WBC: 5.9 10*3/uL (ref 4.0–10.5)

## 2015-09-14 MED ORDER — ATENOLOL 50 MG PO TABS: 50.0000 mg | ORAL_TABLET | Freq: Every day | ORAL | 1 refills | Status: AC

## 2015-09-14 MED ORDER — CENTRUM PO CHEW: 1.0000 | CHEWABLE_TABLET | Freq: Every day | ORAL | 2 refills | Status: AC

## 2015-09-14 MED ORDER — FERROUS SULFATE 325 (65 FE) MG PO TABS: 325.0000 mg | ORAL_TABLET | Freq: Every day | ORAL | 3 refills | Status: AC

## 2015-09-14 MED ORDER — FAMOTIDINE 40 MG PO TABS: 40.0000 mg | ORAL_TABLET | Freq: Every day | ORAL | 2 refills | Status: AC

## 2015-09-14 NOTE — Discharge Summary (Signed)
Physician Discharge Summary  Patient ID: Joe Soto MRN: [id number] DOB/AGE: 07-28-1923 82 y.o.  Admit date: 09/13/2015 Discharge date: 09/14/2015  Primary Discharge Diagnosis CAD native vessel. Ischemic cardiomyopathy with chronic systolic and diastolic heart failure  Secondary Discharge Diagnosis Hypertension Chronic renal insufficiency stage III Chronic anemia  Significant Diagnostic Studies: 09/13/2015:  1. Successful atherectomy with CSI 1.25 mm crown in the mid and distal RCA. PDA lesion could not be attempted as we had difficulty in crossing even a 1.2 mm balloon to place the atherectomy wire in the lumen in the PDA. Hence PDA lesion was left alone.   H/O  LAD stenting in June 2015 with implantation of a 2.5 x 18 mm Xience DES, overlapping proximally with  2.5 x 38 mm Synergy stent on 08/23/2015  2. Successful PTCA and stenting of the distal RCA and mid RCA with implantation of overlapping 2.5 x 28, 2.5 x 33 mm Xience Alpine DES. The mid RCA dissection was well healed and sealed off following stenting. 3. Complications included microperforation in the distal end of the distal stent and IV dissection with TIMI-3 flow into the PDA and PL branch without hemodynamic compromise.  Echocardiogram 09/13/2015: Trace pericardial effusion, severe LV systolic dysfunction with global hypokinesis, mild to moderate mitral regurgitation.  Hospital Course: Patient admitted for elective coronary angiography and angioplasty to the calcified RCA, underwent successful angioplasty, minor complication evident which is inconsequential. Patient had nausea and headaches after he received nitroglycerin and fentanyl. He also had sundowning but this morning very alert, states that he feels well and denies any chest pain or shortness of breath. States that his breathing has improved since angioplasty. No complication at the right groin site.  Recommendations on discharge:  I have held his valsartan due to  chronic renal insufficiency to avoid contrast nephropathy. I will obtain BMP in about 1 week to 10 days. I have started him on iron supplements for anemia along with Pepcid and multivitamins. He'll continue with aspirin and Plavix. I will see him back in the office in one week to 10 days. I have increased the dose of atenolol from 25 mg to 50 mg.  Discharge Exam: Blood pressure (!) 148/56, pulse 73, temperature 98 F (36.7 C), temperature source Oral, resp. rate 15, height 5\' 11"  (1.803 m), weight 60.6 kg (133 lb 11.2 oz), SpO2 96 %.   General appearance: alert, cooperative, appears stated age and no distress Resp: clear to auscultation bilaterally and Decreased breath sounds in the bases Chest wall: no tenderness Cardio: regular rate and rhythm, S1, S2 normal, no murmur, click, rub or gallop GI: soft, non-tender; bowel sounds normal; no masses,  no organomegaly Extremities: extremities normal, atraumatic, no cyanosis or edema and Right groin access site without any hematoma. Neurologic: Grossly normal  Labs:   Lab Results  Component Value Date   WBC 5.9 09/14/2015   HGB 8.0 (L) 09/14/2015   HCT 24.1 (L) 09/14/2015   MCV 109.0 (H) 09/14/2015   PLT 160 09/14/2015    Recent Labs Lab 09/14/15 0525  NA 142  K 4.6  CL 110  CO2 25  BUN 31*  CREATININE 1.31*  CALCIUM 8.9  GLUCOSE 122*   EKG 09/14/2015: Normal sinus rhythm at a rate of 71 bpm, left axis deviation, left anterior fascicular block. Right bundle branch block. PVC. Borderline criteria for LVH.  FOLLOW UP PLANS AND APPOINTMENTS    Medication List    STOP taking these medications   valsartan 160 MG tablet  Commonly known as:  DIOVAN     TAKE these medications   aspirin 81 MG chewable tablet Chew 1 tablet (81 mg total) by mouth daily.   atenolol 50 MG tablet Commonly known as:  TENORMIN Take 1 tablet (50 mg total) by mouth daily. What changed:  medication strength  how much to take   clopidogrel 75 MG  tablet Commonly known as:  PLAVIX Take 1 tablet (75 mg total) by mouth daily.   famotidine 40 MG tablet Commonly known as:  PEPCID Take 1 tablet (40 mg total) by mouth daily.   ferrous sulfate 325 (65 FE) MG tablet Commonly known as:  FEOSOL Take 1 tablet (325 mg total) by mouth daily with breakfast.   fluticasone-salmeterol 230-21 MCG/ACT inhaler Commonly known as:  ADVAIR HFA Inhale 2 puffs into the lungs 2 (two) times daily as needed (shortness of breath).   furosemide 20 MG tablet Commonly known as:  LASIX Take 1 tablet (20 mg total) by mouth daily.   hydrALAZINE 25 MG tablet Commonly known as:  APRESOLINE Take 1 tablet (25 mg total) by mouth 3 (three) times daily. What changed:  when to take this   levothyroxine 75 MCG tablet Commonly known as:  SYNTHROID, LEVOTHROID Take 75 mcg by mouth daily.   multivitamin-iron-minerals-folic acid chewable tablet Chew 1 tablet by mouth daily.   nitroGLYCERIN 0.4 MG SL tablet Commonly known as:  NITROSTAT Place 0.4 mg under the tongue every 5 (five) minutes as needed for chest pain.   potassium chloride 10 MEQ tablet Commonly known as:  K-DUR Take 1 tablet (10 mEq total) by mouth daily.   PROVENTIL HFA 108 (90 Base) MCG/ACT inhaler Generic drug:  albuterol USE 2 PUFFS EVERY 4-6 HOURS AS NEEDED FOR ASHTMA FLAREUP. What changed:  Another medication with the same name was removed. Continue taking this medication, and follow the directions you see here.   rosuvastatin 5 MG tablet Commonly known as:  CRESTOR Take 5 mg by mouth daily.      Follow-up Information    Adrian Prows, MD .   Specialty:  Cardiology Why:  [place]. BRING ALL MEDICATIONS TO OFFICE Contact information: [place]. [address] [address] [phone]            Adrian Prows, MD 09/14/2015, 8:17 AM  Pager: [phone] Office: [phone] If no answer: [phone]

## 2015-09-14 NOTE — Progress Notes (Signed)
CARDIAC REHAB PHASE I   PRE:  Rate/Rhythm: 70 SR  BP:  Sitting: 134/41        SaO2: 96 RA  MODE:  Ambulation: 300 ft   POST:  Rate/Rhythm: 88 SR  BP:  Sitting: 187/62         SaO2: 97 RA  Pt ambulated 300 ft on RA, handheld assist, mostly steady gait, tolerated fairly well.  Pt c/o dizziness, nausea, denies cp, declined rest stop. Completed PCI/stent education with pt, pt's wife and daughter at bedside.  Reviewed risk factors, anti-platelet therapy, stent card, activity restrictions, ntg, exercise, heart healthy diet, sodium restrictions, s/s heart failure, daily weights, and phase 2 cardiac rehab. Pt and family verbalized understanding. Pt agrees to phase 2 cardiac rehab referral, will send to [facility] per pt request. Pt to recliner after walk, call bell within reach, family at bedside.   [id number] Lenna Sciara, RN, BSN 09/14/2015 8:38 AM

## 2015-11-13 ENCOUNTER — Other Ambulatory Visit: Payer: Self-pay | Admitting: Pulmonary Disease

## 2016-02-28 ENCOUNTER — Encounter (HOSPITAL_COMMUNITY): Payer: Self-pay | Admitting: Endocrinology

## 2016-02-28 NOTE — Progress Notes (Signed)
Mailed letter with Cardiac Rehab Program to pt ... KJ  °

## 2016-03-25 ENCOUNTER — Inpatient Hospital Stay (HOSPITAL_COMMUNITY)
Admission: EM | Admit: 2016-03-25 | Discharge: 2016-03-30 | DRG: 280 | Disposition: A | Discharge status: Home Health | Payer: Medicare Other | Attending: Internal Medicine | Admitting: Internal Medicine

## 2016-03-25 ENCOUNTER — Emergency Department (HOSPITAL_COMMUNITY): Payer: Medicare Other

## 2016-03-25 ENCOUNTER — Encounter (HOSPITAL_COMMUNITY): Payer: Self-pay

## 2016-03-25 DIAGNOSIS — Z955 Presence of coronary angioplasty implant and graft: Secondary | ICD-10-CM

## 2016-03-25 DIAGNOSIS — I5043 Acute on chronic combined systolic (congestive) and diastolic (congestive) heart failure: Secondary | ICD-10-CM | POA: Diagnosis present

## 2016-03-25 DIAGNOSIS — J9601 Acute respiratory failure with hypoxia: Secondary | ICD-10-CM | POA: Diagnosis present

## 2016-03-25 DIAGNOSIS — I130 Hypertensive heart and chronic kidney disease with heart failure and stage 1 through stage 4 chronic kidney disease, or unspecified chronic kidney disease: Secondary | ICD-10-CM | POA: Diagnosis present

## 2016-03-25 DIAGNOSIS — Z9861 Coronary angioplasty status: Secondary | ICD-10-CM

## 2016-03-25 DIAGNOSIS — R079 Chest pain, unspecified: Secondary | ICD-10-CM | POA: Diagnosis present

## 2016-03-25 DIAGNOSIS — I255 Ischemic cardiomyopathy: Secondary | ICD-10-CM | POA: Diagnosis present

## 2016-03-25 DIAGNOSIS — Z91018 Allergy to other foods: Secondary | ICD-10-CM

## 2016-03-25 DIAGNOSIS — Z9841 Cataract extraction status, right eye: Secondary | ICD-10-CM

## 2016-03-25 DIAGNOSIS — Z79899 Other long term (current) drug therapy: Secondary | ICD-10-CM

## 2016-03-25 DIAGNOSIS — Z8546 Personal history of malignant neoplasm of prostate: Secondary | ICD-10-CM

## 2016-03-25 DIAGNOSIS — Z8249 Family history of ischemic heart disease and other diseases of the circulatory system: Secondary | ICD-10-CM

## 2016-03-25 DIAGNOSIS — N281 Cyst of kidney, acquired: Secondary | ICD-10-CM | POA: Diagnosis present

## 2016-03-25 DIAGNOSIS — Z96651 Presence of right artificial knee joint: Secondary | ICD-10-CM | POA: Diagnosis present

## 2016-03-25 DIAGNOSIS — I10 Essential (primary) hypertension: Secondary | ICD-10-CM

## 2016-03-25 DIAGNOSIS — N179 Acute kidney failure, unspecified: Secondary | ICD-10-CM | POA: Diagnosis present

## 2016-03-25 DIAGNOSIS — I272 Pulmonary hypertension, unspecified: Secondary | ICD-10-CM | POA: Diagnosis present

## 2016-03-25 DIAGNOSIS — K8020 Calculus of gallbladder without cholecystitis without obstruction: Secondary | ICD-10-CM | POA: Diagnosis present

## 2016-03-25 DIAGNOSIS — Z9981 Dependence on supplemental oxygen: Secondary | ICD-10-CM

## 2016-03-25 DIAGNOSIS — Z885 Allergy status to narcotic agent status: Secondary | ICD-10-CM

## 2016-03-25 DIAGNOSIS — K219 Gastro-esophageal reflux disease without esophagitis: Secondary | ICD-10-CM | POA: Diagnosis present

## 2016-03-25 DIAGNOSIS — D638 Anemia in other chronic diseases classified elsewhere: Secondary | ICD-10-CM | POA: Diagnosis present

## 2016-03-25 DIAGNOSIS — I214 Non-ST elevation (NSTEMI) myocardial infarction: Principal | ICD-10-CM | POA: Diagnosis present

## 2016-03-25 DIAGNOSIS — Z91013 Allergy to seafood: Secondary | ICD-10-CM

## 2016-03-25 DIAGNOSIS — Z7902 Long term (current) use of antithrombotics/antiplatelets: Secondary | ICD-10-CM

## 2016-03-25 DIAGNOSIS — K810 Acute cholecystitis: Secondary | ICD-10-CM

## 2016-03-25 DIAGNOSIS — N183 Chronic kidney disease, stage 3 (moderate): Secondary | ICD-10-CM | POA: Diagnosis present

## 2016-03-25 DIAGNOSIS — R778 Other specified abnormalities of plasma proteins: Secondary | ICD-10-CM

## 2016-03-25 DIAGNOSIS — Z7982 Long term (current) use of aspirin: Secondary | ICD-10-CM

## 2016-03-25 DIAGNOSIS — Z87891 Personal history of nicotine dependence: Secondary | ICD-10-CM

## 2016-03-25 DIAGNOSIS — R7989 Other specified abnormal findings of blood chemistry: Secondary | ICD-10-CM

## 2016-03-25 DIAGNOSIS — E039 Hypothyroidism, unspecified: Secondary | ICD-10-CM | POA: Diagnosis present

## 2016-03-25 DIAGNOSIS — E785 Hyperlipidemia, unspecified: Secondary | ICD-10-CM | POA: Diagnosis present

## 2016-03-25 DIAGNOSIS — I25110 Atherosclerotic heart disease of native coronary artery with unstable angina pectoris: Secondary | ICD-10-CM | POA: Diagnosis present

## 2016-03-25 DIAGNOSIS — Z9842 Cataract extraction status, left eye: Secondary | ICD-10-CM

## 2016-03-25 DIAGNOSIS — Z961 Presence of intraocular lens: Secondary | ICD-10-CM | POA: Diagnosis present

## 2016-03-25 DIAGNOSIS — Z85828 Personal history of other malignant neoplasm of skin: Secondary | ICD-10-CM

## 2016-03-25 DIAGNOSIS — J4489 Other specified chronic obstructive pulmonary disease: Secondary | ICD-10-CM | POA: Diagnosis present

## 2016-03-25 DIAGNOSIS — Z8673 Personal history of transient ischemic attack (TIA), and cerebral infarction without residual deficits: Secondary | ICD-10-CM

## 2016-03-25 DIAGNOSIS — J449 Chronic obstructive pulmonary disease, unspecified: Secondary | ICD-10-CM | POA: Diagnosis present

## 2016-03-25 LAB — I-STAT TROPONIN, ED
Troponin i, poc: 1.08 ng/mL (ref 0.00–0.08)
Troponin i, poc: 1.14 ng/mL (ref 0.00–0.08)

## 2016-03-25 LAB — TROPONIN I: Troponin I: 1.25 ng/mL (ref <0.03)

## 2016-03-25 LAB — HEPATIC FUNCTION PANEL
ALT: 20 U/L (ref 17–63)
AST: 27 U/L (ref 15–41)
Albumin: 3.9 g/dL (ref 3.5–5.0)
Alkaline Phosphatase: 57 U/L (ref 38–126)
BILIRUBIN DIRECT: 0.3 mg/dL (ref 0.1–0.5)
BILIRUBIN INDIRECT: 1.2 mg/dL — AB (ref 0.3–0.9)
BILIRUBIN TOTAL: 1.5 mg/dL — AB (ref 0.3–1.2)
Total Protein: 6.6 g/dL (ref 6.5–8.1)

## 2016-03-25 LAB — BRAIN NATRIURETIC PEPTIDE

## 2016-03-25 LAB — CBC
HEMATOCRIT: 29.5 % — AB (ref 39.0–52.0)
Hemoglobin: 9.6 g/dL — ABNORMAL LOW (ref 13.0–17.0)
MCH: 35.3 pg — AB (ref 26.0–34.0)
MCHC: 32.5 g/dL (ref 30.0–36.0)
MCV: 108.5 fL — AB (ref 78.0–100.0)
Platelets: 325 10*3/uL (ref 150–400)
RBC: 2.72 MIL/uL — ABNORMAL LOW (ref 4.22–5.81)
RDW: 14.8 % (ref 11.5–15.5)
WBC: 6.3 10*3/uL (ref 4.0–10.5)

## 2016-03-25 LAB — BASIC METABOLIC PANEL
Anion gap: 10 (ref 5–15)
BUN: 36 mg/dL — AB (ref 6–20)
CHLORIDE: 107 mmol/L (ref 101–111)
CO2: 22 mmol/L (ref 22–32)
Calcium: 9.2 mg/dL (ref 8.9–10.3)
Creatinine, Ser: 1.49 mg/dL — ABNORMAL HIGH (ref 0.61–1.24)
GFR calc Af Amer: 46 mL/min — ABNORMAL LOW (ref >60)
GFR, EST NON AFRICAN AMERICAN: 40 mL/min — AB (ref >60)
GLUCOSE: 131 mg/dL — AB (ref 65–99)
POTASSIUM: 4.6 mmol/L (ref 3.5–5.1)
Sodium: 139 mmol/L (ref 135–145)

## 2016-03-25 LAB — MRSA PCR SCREENING: MRSA by PCR: NEGATIVE

## 2016-03-25 LAB — LIPASE, BLOOD: Lipase: 20 U/L (ref 11–51)

## 2016-03-25 MED ORDER — SODIUM CHLORIDE 0.9% FLUSH: 3.0000 mL | INTRAVENOUS | Status: DC | PRN

## 2016-03-25 MED ORDER — ASPIRIN 81 MG PO CHEW
324.0000 mg | CHEWABLE_TABLET | Freq: Once | ORAL | Status: AC
Start: 2016-03-25 — End: 2016-03-25
  Administered 2016-03-25: 324 mg via ORAL
  Filled 2016-03-25: qty 4

## 2016-03-25 MED ORDER — HEPARIN (PORCINE) IN NACL 100-0.45 UNIT/ML-% IJ SOLN
950.0000 [IU]/h | INTRAMUSCULAR | Status: DC
  Administered 2016-03-25: 800 [IU]/h via INTRAVENOUS
  Filled 2016-03-25 (×2): qty 250

## 2016-03-25 MED ORDER — NITROGLYCERIN IN D5W 200-5 MCG/ML-% IV SOLN
0.0000 ug/min | Freq: Once | INTRAVENOUS | Status: AC
  Administered 2016-03-25: 5 ug/min via INTRAVENOUS
  Filled 2016-03-25: qty 250

## 2016-03-25 MED ORDER — ADULT MULTIVITAMIN W/MINERALS CH
1.0000 | ORAL_TABLET | Freq: Every day | ORAL | Status: DC
  Administered 2016-03-26 – 2016-03-30 (×5): 1 via ORAL
  Filled 2016-03-25 (×5): qty 1

## 2016-03-25 MED ORDER — CLOPIDOGREL BISULFATE 75 MG PO TABS
75.0000 mg | ORAL_TABLET | Freq: Every day | ORAL | Status: DC
  Administered 2016-03-26 – 2016-03-30 (×5): 75 mg via ORAL
  Filled 2016-03-25 (×5): qty 1

## 2016-03-25 MED ORDER — ATENOLOL 25 MG PO TABS
50.0000 mg | ORAL_TABLET | Freq: Every day | ORAL | Status: DC
  Administered 2016-03-26 – 2016-03-30 (×5): 50 mg via ORAL
  Filled 2016-03-25 (×3): qty 2
  Filled 2016-03-25: qty 1
  Filled 2016-03-25 (×2): qty 2

## 2016-03-25 MED ORDER — SODIUM CHLORIDE 0.9% FLUSH: 3.0000 mL | Freq: Two times a day (BID) | INTRAVENOUS | Status: DC

## 2016-03-25 MED ORDER — ROSUVASTATIN CALCIUM 10 MG PO TABS
5.0000 mg | ORAL_TABLET | Freq: Every day | ORAL | Status: DC
  Administered 2016-03-26 – 2016-03-27 (×2): 5 mg via ORAL
  Filled 2016-03-25 (×2): qty 1

## 2016-03-25 MED ORDER — ALBUTEROL SULFATE (2.5 MG/3ML) 0.083% IN NEBU
2.5000 mg | INHALATION_SOLUTION | Freq: Three times a day (TID) | RESPIRATORY_TRACT | Status: DC | PRN
  Administered 2016-03-27: 2.5 mg via RESPIRATORY_TRACT

## 2016-03-25 MED ORDER — HYDRALAZINE HCL 25 MG PO TABS
25.0000 mg | ORAL_TABLET | Freq: Every day | ORAL | Status: DC
  Administered 2016-03-26 – 2016-03-30 (×5): 25 mg via ORAL
  Filled 2016-03-25 (×5): qty 1

## 2016-03-25 MED ORDER — LEVOTHYROXINE SODIUM 75 MCG PO TABS
75.0000 ug | ORAL_TABLET | Freq: Every day | ORAL | Status: DC
  Administered 2016-03-26 – 2016-03-30 (×5): 75 ug via ORAL
  Filled 2016-03-25 (×5): qty 1

## 2016-03-25 MED ORDER — FAMOTIDINE 20 MG PO TABS
40.0000 mg | ORAL_TABLET | Freq: Every day | ORAL | Status: DC
  Administered 2016-03-26 – 2016-03-30 (×5): 40 mg via ORAL
  Filled 2016-03-25 (×5): qty 2

## 2016-03-25 MED ORDER — ZOLPIDEM TARTRATE 5 MG PO TABS
5.0000 mg | ORAL_TABLET | Freq: Once | ORAL | Status: AC
  Administered 2016-03-25: 5 mg via ORAL
  Filled 2016-03-25: qty 1

## 2016-03-25 MED ORDER — ACETAMINOPHEN 325 MG PO TABS
650.0000 mg | ORAL_TABLET | ORAL | Status: DC | PRN
  Administered 2016-03-26: 325 mg via ORAL
  Filled 2016-03-25 (×2): qty 2

## 2016-03-25 MED ORDER — NITROGLYCERIN IN D5W 200-5 MCG/ML-% IV SOLN
0.0000 ug/min | Freq: Once | INTRAVENOUS | Status: AC
  Administered 2016-03-26: 55 ug/min via INTRAVENOUS
  Filled 2016-03-25: qty 250

## 2016-03-25 MED ORDER — HEPARIN BOLUS VIA INFUSION
4000.0000 [IU] | Freq: Once | INTRAVENOUS | Status: AC
  Administered 2016-03-25: 4000 [IU] via INTRAVENOUS
  Filled 2016-03-25: qty 4000

## 2016-03-25 MED ORDER — CENTRUM PO CHEW
1.0000 | CHEWABLE_TABLET | Freq: Every day | ORAL | Status: DC
Start: 2016-03-26 — End: 2016-03-25

## 2016-03-25 MED ORDER — ONDANSETRON HCL 4 MG/2ML IJ SOLN
4.0000 mg | Freq: Four times a day (QID) | INTRAMUSCULAR | Status: DC | PRN
  Administered 2016-03-27: 4 mg via INTRAVENOUS
  Filled 2016-03-25 (×2): qty 2

## 2016-03-25 MED ORDER — SODIUM CHLORIDE 0.9 % IV SOLN: 250.0000 mL | INTRAVENOUS | Status: DC | PRN

## 2016-03-25 MED ORDER — SODIUM CHLORIDE 0.9 % IV SOLN
INTRAVENOUS | Status: DC
  Administered 2016-03-26: 05:00:00 via INTRAVENOUS

## 2016-03-25 MED ORDER — ASPIRIN 81 MG PO CHEW
81.0000 mg | CHEWABLE_TABLET | Freq: Every day | ORAL | Status: DC
  Administered 2016-03-26 – 2016-03-30 (×5): 81 mg via ORAL
  Filled 2016-03-25 (×5): qty 1

## 2016-03-25 NOTE — ED Triage Notes (Signed)
Patient complains of 3 days of congestion with cough and chest pain. States that he also has a burning feeling in abdomen. No nausea, no vomiting, no diarrhea. denies chills, denies fever. Reports that the pain is worse when he lays down. Alert and oriented, NAD
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
## 2016-03-25 NOTE — ED Provider Notes (Signed)
I saw and evaluated the patient, reviewed the resident's note and I agree with the findings and plan.  Pertinent History: Pt with long history of CAD s/p stenting - presents with increased SOB and leg swelling and a vague sense of an abnormal feeling in the upper abdomen and chest. He was short of breath yesterday, feels like it was his asthma but he has stopped taking his Lasix. Symptoms are persistent, gradually worsening, not associated with fevers  Pertinent Exam findings: On exam the patient has 1+ pitting edema left greater than right lower extremity, abdomen is soft and nontender, heart sounds are clear and regular without murmurs rubs or gallops. There is no JVD, the neck is supple, trachea is midline, no lymphadenopathy. Mucous members are moist.   EKG Interpretation  Date/Time:  Sunday March 25 2016 18:12:14 EDT Ventricular Rate:  67 PR Interval:  170 QRS Duration: 100 QT Interval:  438 QTC Calculation: 462 R Axis:   69 Text Interpretation:  Sinus rhythm with occasional Premature ventricular complexes Incomplete right bundle branch block Anteroseptal infarct , age undetermined ST & T wave abnormality, consider inferolateral ischemia Abnormal ECG Since last tracing ST depression in the lateral precordial leads now present Confirmed by Amaurie Wandel  MD, Renada Cronin ([id number]) on 03/25/2016 6:24:29 PM      The patient's EKG is abnormal, history upon it is elevated greater than 1, this is consistent with a non-ST elevation MI. This is likely complicated somewhat by some congestive heart failure, the cardiologist was involved in a most immediately and he agrees to evaluate the patient and admit. Has requested both nitroglycerin and heparin drips. Will need to get the patient chest pain-free or may need heart catheterization today. I appreciate Dr. Einar Gip and his rapid response to her request for assistance.  I personally interpreted the EKG as well as the resident and agree with the interpretation on the  resident's chart.  Final diagnoses:  Elevated troponin  NSTEMI   CRITICAL CARE Performed by: Johnna Acosta Total critical care time: 35 minutes Critical care time was exclusive of separately billable procedures and treating other patients. Critical care was necessary to treat or prevent imminent or life-threatening deterioration. Critical care was time spent personally by me on the following activities: development of treatment plan with patient and/or surrogate as well as nursing, discussions with consultants, evaluation of patient's response to treatment, examination of patient, obtaining history from patient or surrogate, ordering and performing treatments and interventions, ordering and review of laboratory studies, ordering and review of radiographic studies, pulse oximetry and re-evaluation of patient's condition.    Noemi Chapel, MD 03/26/16 [phone]
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
## 2016-03-25 NOTE — ED Notes (Signed)
Dr Sabra Heck given a copy of troponin results 1.14

## 2016-03-25 NOTE — ED Notes (Signed)
Patient transported to X-ray 

## 2016-03-26 ENCOUNTER — Encounter (HOSPITAL_COMMUNITY)
Admission: EM | Disposition: A | Discharge status: Home Health | Payer: Self-pay | Source: Home / Self Care | Attending: Internal Medicine

## 2016-03-26 ENCOUNTER — Observation Stay (HOSPITAL_COMMUNITY): Payer: Medicare Other

## 2016-03-26 ENCOUNTER — Encounter (HOSPITAL_COMMUNITY): Payer: Self-pay | Admitting: Cardiology

## 2016-03-26 DIAGNOSIS — Z85828 Personal history of other malignant neoplasm of skin: Secondary | ICD-10-CM | POA: Diagnosis not present

## 2016-03-26 DIAGNOSIS — K219 Gastro-esophageal reflux disease without esophagitis: Secondary | ICD-10-CM | POA: Diagnosis present

## 2016-03-26 DIAGNOSIS — E785 Hyperlipidemia, unspecified: Secondary | ICD-10-CM | POA: Diagnosis present

## 2016-03-26 DIAGNOSIS — Z7982 Long term (current) use of aspirin: Secondary | ICD-10-CM | POA: Diagnosis not present

## 2016-03-26 DIAGNOSIS — I130 Hypertensive heart and chronic kidney disease with heart failure and stage 1 through stage 4 chronic kidney disease, or unspecified chronic kidney disease: Secondary | ICD-10-CM | POA: Diagnosis present

## 2016-03-26 DIAGNOSIS — N179 Acute kidney failure, unspecified: Secondary | ICD-10-CM | POA: Diagnosis present

## 2016-03-26 DIAGNOSIS — J9601 Acute respiratory failure with hypoxia: Secondary | ICD-10-CM | POA: Diagnosis present

## 2016-03-26 DIAGNOSIS — I214 Non-ST elevation (NSTEMI) myocardial infarction: Secondary | ICD-10-CM | POA: Diagnosis present

## 2016-03-26 DIAGNOSIS — Z8546 Personal history of malignant neoplasm of prostate: Secondary | ICD-10-CM | POA: Diagnosis not present

## 2016-03-26 DIAGNOSIS — R079 Chest pain, unspecified: Secondary | ICD-10-CM | POA: Diagnosis present

## 2016-03-26 DIAGNOSIS — Z9841 Cataract extraction status, right eye: Secondary | ICD-10-CM | POA: Diagnosis not present

## 2016-03-26 DIAGNOSIS — I255 Ischemic cardiomyopathy: Secondary | ICD-10-CM

## 2016-03-26 DIAGNOSIS — I25110 Atherosclerotic heart disease of native coronary artery with unstable angina pectoris: Secondary | ICD-10-CM | POA: Diagnosis present

## 2016-03-26 DIAGNOSIS — Z8673 Personal history of transient ischemic attack (TIA), and cerebral infarction without residual deficits: Secondary | ICD-10-CM | POA: Diagnosis not present

## 2016-03-26 DIAGNOSIS — Z9842 Cataract extraction status, left eye: Secondary | ICD-10-CM | POA: Diagnosis not present

## 2016-03-26 DIAGNOSIS — I5043 Acute on chronic combined systolic (congestive) and diastolic (congestive) heart failure: Secondary | ICD-10-CM | POA: Diagnosis present

## 2016-03-26 DIAGNOSIS — Z96651 Presence of right artificial knee joint: Secondary | ICD-10-CM | POA: Diagnosis present

## 2016-03-26 DIAGNOSIS — J449 Chronic obstructive pulmonary disease, unspecified: Secondary | ICD-10-CM | POA: Diagnosis present

## 2016-03-26 DIAGNOSIS — Z955 Presence of coronary angioplasty implant and graft: Secondary | ICD-10-CM | POA: Diagnosis not present

## 2016-03-26 DIAGNOSIS — N183 Chronic kidney disease, stage 3 (moderate): Secondary | ICD-10-CM | POA: Diagnosis present

## 2016-03-26 DIAGNOSIS — E039 Hypothyroidism, unspecified: Secondary | ICD-10-CM | POA: Diagnosis present

## 2016-03-26 DIAGNOSIS — Z79899 Other long term (current) drug therapy: Secondary | ICD-10-CM | POA: Diagnosis not present

## 2016-03-26 DIAGNOSIS — Z7902 Long term (current) use of antithrombotics/antiplatelets: Secondary | ICD-10-CM | POA: Diagnosis not present

## 2016-03-26 DIAGNOSIS — Z9981 Dependence on supplemental oxygen: Secondary | ICD-10-CM | POA: Diagnosis not present

## 2016-03-26 DIAGNOSIS — Z961 Presence of intraocular lens: Secondary | ICD-10-CM | POA: Diagnosis present

## 2016-03-26 HISTORY — PX: LEFT HEART CATH AND CORONARY ANGIOGRAPHY: CATH118249

## 2016-03-26 LAB — CBC
HEMATOCRIT: 25.5 % — AB (ref 39.0–52.0)
HEMOGLOBIN: 8.5 g/dL — AB (ref 13.0–17.0)
MCH: 35.9 pg — AB (ref 26.0–34.0)
MCHC: 33.3 g/dL (ref 30.0–36.0)
MCV: 107.6 fL — AB (ref 78.0–100.0)
Platelets: 286 10*3/uL (ref 150–400)
RBC: 2.37 MIL/uL — AB (ref 4.22–5.81)
RDW: 14.9 % (ref 11.5–15.5)
WBC: 5.4 10*3/uL (ref 4.0–10.5)

## 2016-03-26 LAB — HEPARIN LEVEL (UNFRACTIONATED): Heparin Unfractionated: 0.25 IU/mL — ABNORMAL LOW (ref 0.30–0.70)

## 2016-03-26 LAB — IRON AND TIBC
Iron: 66 ug/dL (ref 45–182)
Saturation Ratios: 26 % (ref 17.9–39.5)
TIBC: 249 ug/dL — ABNORMAL LOW (ref 250–450)
UIBC: 183 ug/dL

## 2016-03-26 LAB — LIPID PANEL
CHOL/HDL RATIO: 4.1 ratio
CHOLESTEROL: 116 mg/dL (ref 0–200)
HDL: 28 mg/dL — ABNORMAL LOW (ref >40)
LDL Cholesterol: 77 mg/dL (ref 0–99)
Triglycerides: 56 mg/dL (ref <150)
VLDL: 11 mg/dL (ref 0–40)

## 2016-03-26 LAB — PROTIME-INR
INR: 1.61
Prothrombin Time: 19.3 seconds — ABNORMAL HIGH (ref 11.4–15.2)

## 2016-03-26 LAB — RETICULOCYTES
RBC.: 2.22 MIL/uL — ABNORMAL LOW (ref 4.22–5.81)
RETIC COUNT ABSOLUTE: 48.8 10*3/uL (ref 19.0–186.0)
Retic Ct Pct: 2.2 % (ref 0.4–3.1)

## 2016-03-26 LAB — FOLATE: FOLATE: 23 ng/mL (ref >5.9)

## 2016-03-26 LAB — BASIC METABOLIC PANEL
ANION GAP: 15 (ref 5–15)
BUN: 39 mg/dL — ABNORMAL HIGH (ref 6–20)
CHLORIDE: 107 mmol/L (ref 101–111)
CO2: 19 mmol/L — AB (ref 22–32)
Calcium: 8.6 mg/dL — ABNORMAL LOW (ref 8.9–10.3)
Creatinine, Ser: 1.57 mg/dL — ABNORMAL HIGH (ref 0.61–1.24)
GFR calc non Af Amer: 37 mL/min — ABNORMAL LOW (ref >60)
GFR, EST AFRICAN AMERICAN: 43 mL/min — AB (ref >60)
Glucose, Bld: 109 mg/dL — ABNORMAL HIGH (ref 65–99)
POTASSIUM: 4.2 mmol/L (ref 3.5–5.1)
Sodium: 141 mmol/L (ref 135–145)

## 2016-03-26 LAB — VITAMIN B12: Vitamin B-12: 222 pg/mL (ref 180–914)

## 2016-03-26 LAB — TROPONIN I
Troponin I: 1.21 ng/mL (ref <0.03)
Troponin I: 0.88 ng/mL (ref <0.03)

## 2016-03-26 LAB — FERRITIN: Ferritin: 344 ng/mL — ABNORMAL HIGH (ref 24–336)

## 2016-03-26 LAB — TSH: TSH: 20.879 u[IU]/mL — ABNORMAL HIGH (ref 0.350–4.500)

## 2016-03-26 SURGERY — LEFT HEART CATH AND CORONARY ANGIOGRAPHY

## 2016-03-26 MED ORDER — SODIUM CHLORIDE 0.9% FLUSH: 3.0000 mL | INTRAVENOUS | Status: DC | PRN

## 2016-03-26 MED ORDER — LIDOCAINE HCL (PF) 1 % IJ SOLN
INTRAMUSCULAR | Status: DC | PRN
  Administered 2016-03-26: 14 mL

## 2016-03-26 MED ORDER — MIDAZOLAM HCL 2 MG/2ML IJ SOLN
INTRAMUSCULAR | Status: AC
  Filled 2016-03-26: qty 2

## 2016-03-26 MED ORDER — LIDOCAINE HCL (PF) 1 % IJ SOLN
INTRAMUSCULAR | Status: AC
  Filled 2016-03-26: qty 30

## 2016-03-26 MED ORDER — NITROGLYCERIN 1 MG/10 ML FOR IR/CATH LAB
INTRA_ARTERIAL | Status: AC
  Filled 2016-03-26: qty 10

## 2016-03-26 MED ORDER — HEPARIN (PORCINE) IN NACL 2-0.9 UNIT/ML-% IJ SOLN
INTRAMUSCULAR | Status: AC
  Filled 2016-03-26: qty 1000

## 2016-03-26 MED ORDER — IOPAMIDOL (ISOVUE-370) INJECTION 76%
INTRAVENOUS | Status: AC
  Filled 2016-03-26: qty 100

## 2016-03-26 MED ORDER — SODIUM CHLORIDE 0.9 % IV SOLN: 250.0000 mL | INTRAVENOUS | Status: DC | PRN

## 2016-03-26 MED ORDER — IOPAMIDOL (ISOVUE-370) INJECTION 76%
INTRAVENOUS | Status: DC | PRN
  Administered 2016-03-26: 25 mL via INTRA_ARTERIAL

## 2016-03-26 MED ORDER — SODIUM CHLORIDE 0.9% FLUSH
3.0000 mL | Freq: Two times a day (BID) | INTRAVENOUS | Status: DC
  Administered 2016-03-26 – 2016-03-30 (×8): 3 mL via INTRAVENOUS

## 2016-03-26 MED ORDER — FUROSEMIDE 10 MG/ML IJ SOLN
40.0000 mg | Freq: Two times a day (BID) | INTRAMUSCULAR | Status: DC
  Administered 2016-03-26: 40 mg via INTRAVENOUS
  Filled 2016-03-26: qty 4

## 2016-03-26 MED ORDER — HEPARIN (PORCINE) IN NACL 2-0.9 UNIT/ML-% IJ SOLN
INTRAMUSCULAR | Status: DC | PRN
  Administered 2016-03-26: 1000 mL via INTRA_ARTERIAL

## 2016-03-26 MED ORDER — HEPARIN SODIUM (PORCINE) 5000 UNIT/ML IJ SOLN
5000.0000 [IU] | Freq: Three times a day (TID) | INTRAMUSCULAR | Status: DC
  Administered 2016-03-26 – 2016-03-29 (×10): 5000 [IU] via SUBCUTANEOUS
  Filled 2016-03-26 (×10): qty 1

## 2016-03-26 MED ORDER — SODIUM CHLORIDE 0.9 % IV SOLN: INTRAVENOUS | Status: AC

## 2016-03-26 MED ORDER — MIDAZOLAM HCL 2 MG/2ML IJ SOLN
INTRAMUSCULAR | Status: DC | PRN
  Administered 2016-03-26: 1 mg via INTRAVENOUS

## 2016-03-26 MED ORDER — VERAPAMIL HCL 2.5 MG/ML IV SOLN
INTRAVENOUS | Status: AC
Start: 2016-03-26 — End: 2016-03-26
  Filled 2016-03-26: qty 2

## 2016-03-26 SURGICAL SUPPLY — 13 items
CATH EXPO 5FR FL4 (CATHETERS) ×3 IMPLANT
CATH EXPO 5FR FR4 (CATHETERS) ×3 IMPLANT
DEVICE CLOSURE PERCLS PRGLD 6F (VASCULAR PRODUCTS) ×1 IMPLANT
ELECT DEFIB PAD ADLT CADENCE (PAD) ×3 IMPLANT
GLIDESHEATH SLEND A-KIT 6F 20G (SHEATH) IMPLANT
KIT HEART LEFT (KITS) ×3 IMPLANT
PACK CARDIAC CATHETERIZATION (CUSTOM PROCEDURE TRAY) ×3 IMPLANT
PERCLOSE PROGLIDE 6F (VASCULAR PRODUCTS) ×3
SET INTRODUCER MICROPUNCT 5F (INTRODUCER) ×3 IMPLANT
SHEATH PINNACLE 5F 10CM (SHEATH) ×3 IMPLANT
TRANSDUCER W/STOPCOCK (MISCELLANEOUS) ×3 IMPLANT
TUBING CIL FLEX 10 FLL-RA (TUBING) ×3 IMPLANT
WIRE EMERALD 3MM-J .035X150CM (WIRE) ×3 IMPLANT

## 2016-03-26 NOTE — Progress Notes (Signed)
  Echocardiogram 2D Echocardiogram has been performed.  Joe Soto 03/26/2016, 6:01 PM

## 2016-03-26 NOTE — Progress Notes (Signed)
Pt states that when he walks he has pain that is warm and radiates from his "Lower neck, to torso, to lower legs" Yara Tomkinson V, RN

## 2016-03-26 NOTE — Progress Notes (Addendum)
[facility] PROGRESS NOTE  FOCH ROSENWALD MEB:[id number] DOB: 1923/04/04 DOA: 03/25/2016   PCP: Limmie Patricia, MD     Assessment/Plan: Principal Problem:   Non-ST elevation (NSTEMI) myocardial infarction [facility]) Active Problems:   Essential hypertension   Chronic obstructive airway disease with asthma (HCC)   S/P PTCA (percutaneous transluminal coronary angioplasty)   Cardiomyopathy, ischemic   Chest pain   83 y.o. male with history of CAD status post stenting last one in August 2017, ischemic cardiomyopathy, COPD, chronic kidney disease stage III and anemia presented to the ER because of chest and abdominal discomfort. Abnormal troponin. Cardiology consulted.  Assessment and plan  Non-ST elevation MI - patient's cardiologist Dr. Einar Gip has been consulted .   cardiac cath 3/12 showed very complex and coronary intervention probably is not a good option in view of severe anemia, renal insufficiency and diffusely diseased and calcified RCA.  cardiac markers abnormal. continue nitroglycerin infusion and heparin infusion. Patient is on aspirin and Plavix statins and beta blocker. Abdomen appears benign.  Ischemic cardiomyopathy,acute diastolic heart failure and systolic heart failure ,with last EF measured as 25-30% -held  ARB and Lasix in anticipation of cardiac cath.  LVEDP elevated , resume lasix with careful monitoring of cr   Hypertension stable, on hydralazine.  Atenolol,  Chronic kidney disease stage III - baseline 1.3, currently 1.57  COPD not actively wheezing. Chest x-ray shows small bilateral pleural effusions and mild cardiomegaly  Hypothyroidism on Synthroid. Check TSH  Chronic anemia -baseline around 8.8, follow CBC. Will need further workup as outpatient.     DVT prophylaxsis   Code Status:  Full code     Family Communication: Discussed in detail with the patient, all imaging results, lab results explained to the patient   Disposition Plan:   As per cardiology , dc in am if cr normal post cath     Consultants: Cardiology  Procedures:  Cardiac 3/12 Coronary angiogram 03/26/2016: 1. Markedly elevated LVEDP, 30 mmHg. No pressure gradient across the aortic valve. LV gram not performed to preserve contrast. 2. Mid RCA and mid to distal RCA stents placed in August 2017 are widely patent. PDA high-grade stenosis of 90-95% evident in a tandem fashion, calcified, small vessels. 3. Large LAD, previously placed stents widely patent, mild disease in the mid to distal LAD calcification evident. 4. Circumflex coronary is medium-sized, mild disease.  Recommendation: Patient's anatomy is very complex and coronary intervention probably is not a good option in view of severe anemia, renal insufficiency and diffusely diseased and calcified RCA, previously I had difficulty in advancing any devices including balloon at the site of the stenosis. Hence would recommend medical therapy. About 25 mL contrast utilized. He has markedly elevated LVEDP suggesting acute diastolic heart failure and systolic heart failure, will need gentle diuresis. However careful watch to be performed with regard to renal failure development. I have sent anemia panel and we will follow-up on this.  Antibiotics: Anti-infectives    None         HPI/Subjective:  debies any cp or sob   Objective: Vitals:   03/26/16 0024 03/26/16 0317 03/26/16 0500 03/26/16 0746  BP: 130/77 126/72  118/70  Pulse: (!) 106 75  64  Resp: (!) 23 (!) 27  (!) 23  Temp: 97.7 F (36.5 C)  98 F (36.7 C) 98 F (36.7 C)  TempSrc: Oral  Oral Oral  SpO2: 90% 100%  94%  Weight:   70.6 kg (155  lb 11.2 oz)   Height:        Intake/Output Summary (Last 24 hours) at 03/26/16 8882 Last data filed at 03/26/16 0600  Gross per 24 hour  Intake           169.11 ml  Output              150 ml  Net            19.11 ml    Exam:  Examination:  General exam: Appears calm and comfortable   Respiratory system: Clear to auscultation. Respiratory effort normal. Cardiovascular system: S1 & S2 heard, RRR. No JVD, murmurs, rubs, gallops or clicks. No pedal edema. Gastrointestinal system: Abdomen is nondistended, soft and nontender. No organomegaly or masses felt. Normal bowel sounds heard. Central nervous system: Alert and oriented. No focal neurological deficits. Extremities: Symmetric 5 x 5 power. Skin: No rashes, lesions or ulcers Psychiatry: Judgement and insight appear normal. Mood & affect appropriate.     Data Reviewed: I have personally reviewed following labs and imaging studies  Micro Results Recent Results (from the past 240 hour(s))  MRSA PCR Screening     Status: None   Collection Time: 03/25/16 10:24 PM  Result Value Ref Range Status   MRSA by PCR NEGATIVE NEGATIVE Final    Comment:        The GeneXpert MRSA Assay (FDA approved for NASAL specimens only), is one component of a comprehensive MRSA colonization surveillance program. It is not intended to diagnose MRSA infection nor to guide or monitor treatment for MRSA infections.     Radiology Reports Dg Chest 2 View  Result Date: 03/25/2016 CLINICAL DATA:  Chest congestion, abdominal pain radiating to shoulders, burning feeling in abdomen for 2 days. History of prostate cancer, hypertension, myocardial infarction. EXAM: CHEST  2 VIEW COMPARISON:  None. FINDINGS: Cardiac silhouette is mildly enlarged, mediastinal silhouette is nonsuspicious, calcified aortic knob. Coronary artery stent. Mild chronic bronchitic changes. Small bilateral pleural effusions and bibasilar strandy densities. No pneumothorax. ACDF. IMPRESSION: Small bilateral pleural effusions and bibasilar atelectasis. Mild cardiomegaly. Electronically Signed   By: Elon Alas M.D.   On: 03/25/2016 19:20     CBC  Recent Labs Lab 03/25/16 1812 03/26/16 0314  WBC 6.3 5.4  HGB 9.6* 8.5*  HCT 29.5* 25.5*  PLT 325 286  MCV 108.5* 107.6*   MCH 35.3* 35.9*  MCHC 32.5 33.3  RDW 14.8 14.9    Chemistries   Recent Labs Lab 03/25/16 1812 03/26/16 0806  NA 139 141  K 4.6 4.2  CL 107 107  CO2 22 19*  GLUCOSE 131* 109*  BUN 36* 39*  CREATININE 1.49* 1.57*  CALCIUM 9.2 8.6*  AST 27  --   ALT 20  --   ALKPHOS 57  --   BILITOT 1.5*  --    ------------------------------------------------------------------------------------------------------------------ estimated creatinine clearance is 31.2 mL/min (by C-G formula based on SCr of 1.57 mg/dL (H)). ------------------------------------------------------------------------------------------------------------------ No results for input(s): HGBA1C in the last 72 hours. ------------------------------------------------------------------------------------------------------------------ No results for input(s): CHOL, HDL, LDLCALC, TRIG, CHOLHDL, LDLDIRECT in the last 72 hours. ------------------------------------------------------------------------------------------------------------------ No results for input(s): TSH, T4TOTAL, T3FREE, THYROIDAB in the last 72 hours.  Invalid input(s): FREET3 ------------------------------------------------------------------------------------------------------------------ No results for input(s): VITAMINB12, FOLATE, FERRITIN, TIBC, IRON, RETICCTPCT in the last 72 hours.  Coagulation profile  Recent Labs Lab 03/26/16 0314  INR 1.61    No results for input(s): DDIMER in the last 72 hours.  Cardiac Enzymes  Recent Labs Lab 03/25/16  2214 03/26/16 0314  TROPONINI 1.25* 1.21*   ------------------------------------------------------------------------------------------------------------------ Invalid input(s): POCBNP   CBG: No results for input(s): GLUCAP in the last 168 hours.     Studies: Dg Chest 2 View  Result Date: 03/25/2016 CLINICAL DATA:  Chest congestion, abdominal pain radiating to shoulders, burning feeling in abdomen for  2 days. History of prostate cancer, hypertension, myocardial infarction. EXAM: CHEST  2 VIEW COMPARISON:  None. FINDINGS: Cardiac silhouette is mildly enlarged, mediastinal silhouette is nonsuspicious, calcified aortic knob. Coronary artery stent. Mild chronic bronchitic changes. Small bilateral pleural effusions and bibasilar strandy densities. No pneumothorax. ACDF. IMPRESSION: Small bilateral pleural effusions and bibasilar atelectasis. Mild cardiomegaly. Electronically Signed   By: Elon Alas M.D.   On: 03/25/2016 19:20      No results found for: HGBA1C Lab Results  Component Value Date   CREATININE 1.57 (H) 03/26/2016       Scheduled Meds: . aspirin  81 mg Oral Daily  . atenolol  50 mg Oral Daily  . clopidogrel  75 mg Oral Daily  . famotidine  40 mg Oral Daily  . hydrALAZINE  25 mg Oral Daily  . levothyroxine  75 mcg Oral QAC breakfast  . multivitamin with minerals  1 tablet Oral Daily  . rosuvastatin  5 mg Oral q1800  . sodium chloride flush  3 mL Intravenous Q12H   Continuous Infusions: . sodium chloride 75 mL/hr at 03/26/16 0524  . heparin 950 Units/hr (03/26/16 0513)     LOS: 0 days    Time spent: >30 MINS    [facility]  [facility] Pager [phone]. If 7PM-7AM, please contact night-coverage at [email or web address], password [facility] 03/26/2016, 9:23 AM  LOS: 0 days

## 2016-03-26 NOTE — Care Management Note (Addendum)
Case Management Note  Patient Details  Name: Joe Soto MRN: [id number] Date of Birth: Jun 14, 1923  Subjective/Objective: Pt presented for chest pain. Post cardiac cath. Plan will be for d/c home once stable. Per pt he was independent prior to arrival.                    Action/Plan: No home needs identified at this time. CM will continue to monitor.   Expected Discharge Date:                  Expected Discharge Plan:  Home/Self Care  In-House Referral:  NA  Discharge planning Services  CM Consult  Post Acute Care Choice: Home Health Choice offered to:  Patient, Spouse  DME Arranged:  N/A DME Agency:  NA  HH Arranged: PT HH Agency:  [facility]  Status of Service:  Completed, signed off  If discussed at [place] of Stay Meetings, dates discussed:    Additional Comments: 1422 03-27-16 Jacqlyn Krauss, RN,BSN [phone] PT recommendations for Home PT- Agency List provided to patient and wife. Both agreeable to [facility]. CM did make referral with [facility].  Agency can not accept due to insurance [employer] and no availability for patients zipcode for therapy.CM did make pt and wife aware. [facility] chosen and CM did make referral with [facility]- checking to make sure insurance is in network. CM will monitor.  Bethena Roys, RN 03/26/2016, 12:30 PM

## 2016-03-26 NOTE — Progress Notes (Signed)
Pt was not able to stay in bed for his bedrest. Family at bedside. Pt very anxious and not cooperative

## 2016-03-26 NOTE — Progress Notes (Signed)
ANTICOAGULATION CONSULT NOTE - Follow-up Consult  Pharmacy Consult for Heparin Indication: chest pain/ACS  Allergies  Allergen Reactions  . Darvon [Propoxyphene] Nausea And Vomiting  . Pineapple Nausea And Vomiting  . Shellfish Allergy Swelling    Shrimp and clams  . Codeine Nausea And Vomiting  . Meperidine Hcl Nausea And Vomiting    Reaction to demerol    Patient Measurements: Height: 5\' 11"  (180.3 cm) Weight: 150 lb (68 kg) IBW/kg (Calculated) : 75.3 Heparin Dosing Weight: 68 kg  Vital Signs: Temp: 97.7 F (36.5 C) (03/12 0024) Temp Source: Oral (03/12 0024) BP: 126/72 (03/12 0317) Pulse Rate: 75 (03/12 0317)  Labs:  Recent Labs  03/25/16 1812 03/25/16 2214 03/26/16 0314  HGB 9.6*  --  8.5*  HCT 29.5*  --  25.5*  PLT 325  --  286  HEPARINUNFRC  --   --  0.25*  CREATININE 1.49*  --   --   TROPONINI  --  1.25*  --     Estimated Creatinine Clearance: 31.7 mL/min (by C-G formula based on SCr of 1.49 mg/dL (H)).  Assessment: 83yo male on heparin for r/o ACS. Heparin level subtherapeutic (0.25) on gtt at 800 units/hr.  No issues with line or bleeding reported per RN.  Goal of Therapy:  Heparin level 0.3-0.7 units/ml Monitor platelets by anticoagulation protocol: Yes   Plan:  Increase heparin to 950 units/hr Will f/u 8 hr heparin level  Sherlon Handing, PharmD, BCPS Clinical pharmacist, pager [phone] 03/26/2016,3:51 AM

## 2016-03-26 NOTE — Interval H&P Note (Signed)
History and Physical Interval Note:  03/26/2016 10:06 AM  Joe Soto  has presented today for surgery, with the diagnosis of cp  The various methods of treatment have been discussed with the patient and family. After consideration of risks, benefits and other options for treatment, the patient has consented to  Procedure(s): Left Heart Cath and Coronary Angiography (N/A) and possible PCI as a surgical intervention .  The patient's history has been reviewed, patient examined, no change in status, stable for surgery.  I have reviewed the patient's chart and labs.  Questions were answered to the patient's satisfaction.   Cath Lab Visit (complete for each Cath Lab visit)  Clinical Evaluation Leading to the Procedure:   ACS: Yes.    Non-ACS:    Anginal Classification: CCS IV  Anti-ischemic medical therapy: Minimal Therapy (1 class of medications)  Non-Invasive Test Results: No non-invasive testing performed  Prior CABG: No previous CABG        Adrian Prows

## 2016-03-26 NOTE — Care Management Obs Status (Signed)
[place] NOTIFICATION   Patient Details  Name: Joe Soto MRN: [id number] Date of Birth: 04-14-1923   Medicare Observation Status Notification Given:  Yes    Bethena Roys, RN 03/26/2016, 12:29 PM

## 2016-03-27 ENCOUNTER — Inpatient Hospital Stay (HOSPITAL_COMMUNITY): Payer: Medicare Other

## 2016-03-27 DIAGNOSIS — I214 Non-ST elevation (NSTEMI) myocardial infarction: Principal | ICD-10-CM

## 2016-03-27 LAB — CBC
HEMATOCRIT: 29.1 % — AB (ref 39.0–52.0)
HEMOGLOBIN: 9.5 g/dL — AB (ref 13.0–17.0)
MCH: 35.8 pg — AB (ref 26.0–34.0)
MCHC: 32.6 g/dL (ref 30.0–36.0)
MCV: 109.8 fL — AB (ref 78.0–100.0)
Platelets: 300 10*3/uL (ref 150–400)
RBC: 2.65 MIL/uL — ABNORMAL LOW (ref 4.22–5.81)
RDW: 15.4 % (ref 11.5–15.5)
WBC: 7 10*3/uL (ref 4.0–10.5)

## 2016-03-27 LAB — ECHOCARDIOGRAM COMPLETE
CHL CUP DOP CALC LVOT VTI: 11.6 cm
CHL CUP TV REG PEAK VELOCITY: 359 cm/s
E decel time: 164 msec
FS: 17 % — AB (ref 28–44)
HEIGHTINCHES: 71 in
IVS/LV PW RATIO, ED: 0.94
LA ID, A-P, ES: 44 mm
LA diam end sys: 44 mm
LA diam index: 2.32 cm/m2
LA vol index: 41.9 mL/m2
LAVOL: 79.7 mL
LAVOLA4C: 71.4 mL
LDCA: 4.15 cm2
LVOT SV: 48 mL
LVOT diameter: 23 mm
LVOT peak vel: 56.4 cm/s
MV Dec: 164
MV pk A vel: 49.9 m/s
MV pk E vel: 99.2 m/s
MVPG: 4 mmHg
P 1/2 time: 395 ms
PW: 10.8 mm — AB (ref 0.6–1.1)
RV TAPSE: 16 mm
RV sys press: 60 mmHg
TR max vel: 359 cm/s
WEIGHTICAEL: 2491.2 [oz_av]

## 2016-03-27 LAB — HEMOGLOBIN A1C
Hgb A1c MFr Bld: 5.2 % (ref 4.8–5.6)
Mean Plasma Glucose: 103 mg/dL

## 2016-03-27 LAB — COMPREHENSIVE METABOLIC PANEL
ALBUMIN: 3.8 g/dL (ref 3.5–5.0)
ALT: 63 U/L (ref 17–63)
ANION GAP: 11 (ref 5–15)
AST: 57 U/L — AB (ref 15–41)
Alkaline Phosphatase: 55 U/L (ref 38–126)
BILIRUBIN TOTAL: 2.3 mg/dL — AB (ref 0.3–1.2)
BUN: 43 mg/dL — AB (ref 6–20)
CHLORIDE: 108 mmol/L (ref 101–111)
CO2: 20 mmol/L — AB (ref 22–32)
Calcium: 8.5 mg/dL — ABNORMAL LOW (ref 8.9–10.3)
Creatinine, Ser: 1.92 mg/dL — ABNORMAL HIGH (ref 0.61–1.24)
GFR calc Af Amer: 34 mL/min — ABNORMAL LOW (ref >60)
GFR calc non Af Amer: 29 mL/min — ABNORMAL LOW (ref >60)
GLUCOSE: 122 mg/dL — AB (ref 65–99)
POTASSIUM: 4.8 mmol/L (ref 3.5–5.1)
SODIUM: 139 mmol/L (ref 135–145)
TOTAL PROTEIN: 6 g/dL — AB (ref 6.5–8.1)

## 2016-03-27 LAB — T4, FREE: Free T4: 0.74 ng/dL (ref 0.61–1.12)

## 2016-03-27 MED ORDER — SODIUM CHLORIDE 0.9 % IV SOLN
INTRAVENOUS | Status: DC
  Administered 2016-03-27 – 2016-03-28 (×2): via INTRAVENOUS

## 2016-03-27 MED ORDER — FUROSEMIDE 40 MG PO TABS
40.0000 mg | ORAL_TABLET | Freq: Every day | ORAL | Status: DC
  Administered 2016-03-28: 40 mg via ORAL
  Filled 2016-03-27: qty 1

## 2016-03-27 NOTE — Progress Notes (Signed)
PROGRESS NOTE    Joe Soto  XAJ:[id number] DOB: Nov 10, 1923 DOA: 03/25/2016 PCP: Limmie Patricia, MD     Brief Narrative:  Joe Soto is a 83 y.o.malewith history of CAD status post stenting in August 2017, ischemic cardiomyopathy, COPD, chronic kidney disease stage III and anemia presented to the ER because of chest and abdominal discomfort. Cardiology consulted.  Assessment & Plan:   Principal Problem:   Non-ST elevation (NSTEMI) myocardial infarction [facility]) Active Problems:   Essential hypertension   Chronic obstructive airway disease with asthma (HCC)   S/P PTCA (percutaneous transluminal coronary angioplasty)   Cardiomyopathy, ischemic   Chest pain   NSTEMI -Cardiology following -S/p cardiac cath 3/12 showed very complex and coronary intervention probably is not a good option in view of severe anemia, renal insufficiency and diffusely diseased and calcified RCA. -Continue aspirin, plavix, crestor   Acute on chronic combined systolic and diastolic heart failure/ Ischemic cardiomyopathy -Echo with EF 09-47%, grade 3 diastolic dysfunction  -Continue atenolol, lasix  -Strict I/Os   AKI on CKD stage 3 -Baseline Cr 1.3  -Worsened in setting of diuretic and contrast  -Gentle IV hydration with lasix 40mg  PO daily  -Trend BMP daily   Hyperbilirubinemia -RUQ [place] ordered   Hypothyroidism -Continue Synthroid -Elevated TSH. Check Free T4  Hypertension -Continue hydralazine, atenolol  COPD -No exacerbation  Chronic macrocytic anemia -Baseline Hgb around 8.8 -Monitor    DVT prophylaxis: subq hep Code Status: Full Family Communication: wife at bedside Disposition Plan: pending improvement   Consultants:   Cardiology  Procedures:   Heart cath 3/12   Antimicrobials:   None    Subjective: Patient states that he is feeling well this morning. He had no complaints of chest pain, shortness of breath or abdominal pain on my examination.  Cardiology note is reviewed and noted that he complained of abdominal pain to Dr. Einar Gip.   Objective: Vitals:   03/26/16 1629 03/26/16 1638 03/27/16 0622 03/27/16 0730  BP: 136/60  (!) 118/55 139/69  Pulse: (!) 48  (!) 56 (!) 57  Resp: 18  16 16   Temp:  97.1 F (36.2 C) 98.4 F (36.9 C) 97.7 F (36.5 C)  TempSrc:  Oral Oral Oral  SpO2: 96%  93% 93%  Weight:   67.7 kg (149 lb 4.8 oz)   Height:        Intake/Output Summary (Last 24 hours) at 03/27/16 1359 Last data filed at 03/26/16 1920  Gross per 24 hour  Intake           329.75 ml  Output              500 ml  Net          -170.25 ml   Filed Weights   03/25/16 1919 03/26/16 0500 03/27/16 0622  Weight: 68 kg (150 lb) 70.6 kg (155 lb 11.2 oz) 67.7 kg (149 lb 4.8 oz)    Examination:  General exam: Appears calm and comfortable  Respiratory system: Clear to auscultation. Respiratory effort normal. Cardiovascular system: S1 & S2 heard, RRR. No JVD, murmurs, rubs, gallops or clicks. No pedal edema. Gastrointestinal system: Abdomen is nondistended, soft and nontender. No organomegaly or masses felt. Normal bowel sounds heard.  Central nervous system: Alert and oriented. No focal neurological deficits. Extremities: Symmetric 5 x 5 power. Skin: No rashes, lesions or ulcers Psychiatry: Judgement and insight appear normal. Mood & affect appropriate.   Data Reviewed: I have personally reviewed following labs and imaging studies  CBC:  Recent Labs Lab 03/25/16 1812 03/26/16 0314 03/27/16 0307  WBC 6.3 5.4 7.0  HGB 9.6* 8.5* 9.5*  HCT 29.5* 25.5* 29.1*  MCV 108.5* 107.6* 109.8*  PLT 325 286 409   Basic Metabolic Panel:  Recent Labs Lab 03/25/16 1812 03/26/16 0806 03/27/16 0307  NA 139 141 139  K 4.6 4.2 4.8  CL 107 107 108  CO2 22 19* 20*  GLUCOSE 131* 109* 122*  BUN 36* 39* 43*  CREATININE 1.49* 1.57* 1.92*  CALCIUM 9.2 8.6* 8.5*   GFR: Estimated Creatinine Clearance: 24.5 mL/min (by C-G formula based on SCr  of 1.92 mg/dL (H)). Liver Function Tests:  Recent Labs Lab 03/25/16 1812 03/27/16 0307  AST 27 57*  ALT 20 63  ALKPHOS 57 55  BILITOT 1.5* 2.3*  PROT 6.6 6.0*  ALBUMIN 3.9 3.8    Recent Labs Lab 03/25/16 1812  LIPASE 20   No results for input(s): AMMONIA in the last 168 hours. Coagulation Profile:  Recent Labs Lab 03/26/16 0314  INR 1.61   Cardiac Enzymes:  Recent Labs Lab 03/25/16 2214 03/26/16 0314 03/26/16 0806  TROPONINI 1.25* 1.21* 0.88*   BNP (last 3 results) No results for input(s): PROBNP in the last 8760 hours. HbA1C:  Recent Labs  03/26/16 0632  HGBA1C 5.2   CBG: No results for input(s): GLUCAP in the last 168 hours. Lipid Profile:  Recent Labs  03/26/16 1135  CHOL 116  HDL 28*  LDLCALC 77  TRIG 56  CHOLHDL 4.1   Thyroid Function Tests:  Recent Labs  03/26/16 1135  TSH 20.879*   Anemia Panel:  Recent Labs  03/26/16 0632  VITAMINB12 222  FOLATE 23.0  FERRITIN 344*  TIBC 249*  IRON 66  RETICCTPCT 2.2   Sepsis Labs: No results for input(s): PROCALCITON, LATICACIDVEN in the last 168 hours.  Recent Results (from the past 240 hour(s))  MRSA PCR Screening     Status: None   Collection Time: 03/25/16 10:24 PM  Result Value Ref Range Status   MRSA by PCR NEGATIVE NEGATIVE Final    Comment:        The GeneXpert MRSA Assay (FDA approved for NASAL specimens only), is one component of a comprehensive MRSA colonization surveillance program. It is not intended to diagnose MRSA infection nor to guide or monitor treatment for MRSA infections.        Radiology Studies: Dg Chest 2 View  Result Date: 03/25/2016 CLINICAL DATA:  Chest congestion, abdominal pain radiating to shoulders, burning feeling in abdomen for 2 days. History of prostate cancer, hypertension, myocardial infarction. EXAM: CHEST  2 VIEW COMPARISON:  None. FINDINGS: Cardiac silhouette is mildly enlarged, mediastinal silhouette is nonsuspicious, calcified  aortic knob. Coronary artery stent. Mild chronic bronchitic changes. Small bilateral pleural effusions and bibasilar strandy densities. No pneumothorax. ACDF. IMPRESSION: Small bilateral pleural effusions and bibasilar atelectasis. Mild cardiomegaly. Electronically Signed   By: Elon Alas M.D.   On: 03/25/2016 19:20      Scheduled Meds: . aspirin  81 mg Oral Daily  . atenolol  50 mg Oral Daily  . clopidogrel  75 mg Oral Daily  . famotidine  40 mg Oral Daily  . [START ON 03/28/2016] furosemide  40 mg Oral Daily  . heparin  5,000 Units Subcutaneous Q8H  . hydrALAZINE  25 mg Oral Daily  . levothyroxine  75 mcg Oral QAC breakfast  . multivitamin with minerals  1 tablet Oral Daily  . rosuvastatin  5 mg Oral  [id number]  . sodium chloride flush  3 mL Intravenous Q12H   Continuous Infusions: . sodium chloride       LOS: 1 day    Time spent: 40 minutes   Dessa Phi, DO [facility] [email or web address] Password [facility] 03/27/2016, 1:59 PM

## 2016-03-27 NOTE — Progress Notes (Addendum)
Subjective:  C/O abdominal pain pretty severe. Had bowel movement, no hematochezia or melena. Wife present the bedside. No PND or orthopnea. No leg edema.  Objective:  Vital Signs in the last 24 hours: Temp:  [97.1 F (36.2 C)-98.4 F (36.9 C)] 97.7 F (36.5 C) (03/13 0730) Pulse Rate:  [48-57] 57 (03/13 0730) Resp:  [16-18] 16 (03/13 0730) BP: (118-139)/(55-69) 139/69 (03/13 0730) SpO2:  [93 %-96 %] 93 % (03/13 0730) Weight:  [149 lb 4.8 oz (67.7 kg)] 149 lb 4.8 oz (67.7 kg) (03/13 0622)  Intake/Output from previous day: 03/12 0701 - 03/13 0700 In: 329.8 [P.O.:60; I.V.:269.8] Out: 500 [Urine:500]  Physical Exam:  General appearance: alert, cooperative, appears stated age and mild distress Eyes: negative findings: lids and lashes normal Neck: no adenopathy, no carotid bruit, supple, symmetrical, trachea midline, thyroid not enlarged, symmetric, no tenderness/mass/nodules and JVD slightly up Neck: JVP - normal, carotids 2+= without bruits Resp: clear to auscultation bilaterally Chest wall: no tenderness Cardio: regular rate and rhythm, S1, S2 normal, no murmur, click, rub or gallop and Distant heart sounds. Frequent ectopy noted GI: Abdomen is soft, diffusely tender throughout, no focal abnormality, no rebound tenderness. Extremities: extremities normal, atraumatic, no cyanosis or edema and Right groin site has healed well.  Lab Results: BMP  Recent Labs  03/25/16 1812 03/26/16 0806 03/27/16 0307  NA 139 141 139  K 4.6 4.2 4.8  CL 107 107 108  CO2 22 19* 20*  GLUCOSE 131* 109* 122*  BUN 36* 39* 43*  CREATININE 1.49* 1.57* 1.92*  CALCIUM 9.2 8.6* 8.5*  GFRNONAA 40* 37* 29*  GFRAA 46* 43* 34*    CBC  Recent Labs Lab 03/27/16 0307  WBC 7.0  RBC 2.65*  HGB 9.5*  HCT 29.1*  PLT 300  MCV 109.8*  MCH 35.8*  MCHC 32.6  RDW 15.4    HEMOGLOBIN A1C Lab Results  Component Value Date   HGBA1C 5.2 03/26/2016   MPG 103 03/26/2016   Cardiac Panel (last 3  results)  Recent Labs  03/25/16 2214 03/26/16 0314 03/26/16 0806  TROPONINI 1.25* 1.21* 0.88*   TSH  Recent Labs  03/26/16 1135  TSH 20.879*    CHOLESTEROL  Recent Labs  03/26/16 1135  CHOL 116    Hepatic Function Panel  Recent Labs  03/25/16 1812 03/27/16 0307  PROT 6.6 6.0*  ALBUMIN 3.9 3.8  AST 27 57*  ALT 20 63  ALKPHOS 57 55  BILITOT 1.5* 2.3*  BILIDIR 0.3  --   IBILI 1.2*  --    Scheduled Meds: . aspirin  81 mg Oral Daily  . atenolol  50 mg Oral Daily  . clopidogrel  75 mg Oral Daily  . famotidine  40 mg Oral Daily  . heparin  5,000 Units Subcutaneous Q8H  . hydrALAZINE  25 mg Oral Daily  . levothyroxine  75 mcg Oral QAC breakfast  . multivitamin with minerals  1 tablet Oral Daily  . rosuvastatin  5 mg Oral q1800  . sodium chloride flush  3 mL Intravenous Q12H   Continuous Infusions: PRN Meds:.sodium chloride, acetaminophen, albuterol, ondansetron (ZOFRAN) IV, sodium chloride flush   Imaging: Imaging results have been reviewed  Cardiac Studies:  Coronary angiogram 03/26/2016, markedly elevated LVEDP. High-grade stenosis of the PDA branch of the right coronary artery. Previously placed stent in the mid RCA and LAD widely patent. Medical therapy.  Echocardiogram 03/26/2016: Severe LV systolic dysfunction, anterolateral hypokinesis, restrictive physiology. moderate to severe mitral regurgitation. Moderate tricuspid  regurgitation. Moderate pulmonary hypertension.  EKG 03/25/2016: Normal sinus rhythm at a rate of 67 beats a minute, normal axis. Anterior infarct old. Lateral ischemia. Compared to prior EKG, lateral ischemia is new.  Assessment/Plan:  1. Non-ST elevation myocardial infarction, probably related to small vessel disease. CAD, medical therapy 2. Ac on Chronic renal insufficiency stage III due to acute on chronic systolic and diastolic heart failure, contrast exposure. 3. Acute on chronic systolic and diastolic heart failure 4. Chronic  anemia suspect anemia of chronic disease, anemia panel unyielding. 5. Hypertension 6. Hyperlipidemia 7. Abdominal discomfort, diffuse and nonspecific, large cyst in the kidney noted on [place]. Amylase and lipase negative.   Recommendation: Will obtain CT abdomen to evalute the cyst, ? Hemorrhage into cyst. S. Cr slightly worse today and expect to improve over next two days.  Fentanyl for abdominal pain  Adrian Prows, M.D. 03/27/2016, 1:10 PM [facility], [place] Pager: [phone] Office: [phone] If no answer: [phone]

## 2016-03-27 NOTE — Evaluation (Signed)
Physical Therapy Evaluation Patient Details Name: Joe Soto MRN: [id number] DOB: [birth year] Today's Date: 03/27/2016   History of Present Illness  Pt is a 83 y.o. male with history of HTN, CAD status post stenting last one in August 2017, ischemic cardiomyopathy, COPD, CKD stage III and anemia presented to ER 03/25/16 because of chest and abdominal discomfort; his troponin was positive for non-ST elevation MI. Other pertinent PMH includes CVA, asthma, HLD, cancer, arthritis. Cardiac cath performed 3/12.   Clinical Impression  Pt presents to PT with generalized weakness and an overall decrease in functional mobility secondary to above. PTA, pt indep with all mobility and community amb, living at home with his wife. Today, pt required max encouragement to walk in room to chair with min guard, performing bed mob with minA; wife present throughout session and encouraged to only assist pt if needed. Educ on importance of mobility and fall risk. Pt would benefit from continued acute PT services to maximize functional mobility and independence before d/c home with HHPT.     Follow Up Recommendations Home health PT    Equipment Recommendations  None recommended by PT    Recommendations for Other Services       Precautions / Restrictions Precautions Precautions: Fall Restrictions Weight Bearing Restrictions: No      Mobility  Bed Mobility Overal bed mobility: Needs Assistance Bed Mobility: Supine to Sit     Supine to sit: HOB elevated;Min assist     General bed mobility comments: Relies on UE to pull into sitting.   Transfers Overall transfer level: Needs assistance Equipment used: None Transfers: Sit to/from Stand Sit to Stand: Supervision            Ambulation/Gait Ambulation/Gait assistance: Min guard Ambulation Distance (Feet): 5 Feet Assistive device: None Gait Pattern/deviations: Step-through pattern;Decreased stride length Gait velocity: Decreased   General  Gait Details: Amb in room to chair with slowed gait; required max encouragement for OOB mobility. Min guard for balance.   Stairs            Wheelchair Mobility    Modified Rankin (Stroke Patients Only)       Balance Overall balance assessment: Needs assistance Sitting-balance support: No upper extremity supported;Feet supported Sitting balance-Leahy Scale: Fair     Standing balance support: No upper extremity supported;During functional activity Standing balance-Leahy Scale: Fair                               Pertinent Vitals/Pain Pain Assessment: Faces Faces Pain Scale: Hurts a little bit Pain Location: Lower abdomen Pain Descriptors / Indicators: Sore Pain Intervention(s): Limited activity within patient's tolerance;Monitored during session    Home Living Family/patient expects to be discharged to:: Private residence Living Arrangements: Spouse/significant other Available Help at Discharge: Available 24 hours/day;Family Type of Home: House Home Access: Stairs to enter Entrance Stairs-Rails: Left Entrance Stairs-Number of Steps: 8 Home Layout: One level Home Equipment: None      Prior Function Level of Independence: Independent               Hand Dominance        Extremity/Trunk Assessment   Upper Extremity Assessment Upper Extremity Assessment: Generalized weakness    Lower Extremity Assessment Lower Extremity Assessment: Generalized weakness       Communication   Communication: HOH  Cognition Arousal/Alertness: Awake/alert Behavior During Therapy: WFL for tasks assessed/performed Overall Cognitive Status: Impaired/Different from baseline Area of Impairment:  Safety/judgement;Awareness         Safety/Judgement: Decreased awareness of safety Awareness: Emergent   General Comments: Pt required max encouragement for participation with PT secondary to feeling tired; reports "I'll be huffing and puffing if I try to walk to  the door". Pt has been amb to bathroom per RN. Pt with increased distraction throughout session and uncooperative at times when asked to participate with therapy.     General Comments      Exercises     Assessment/Plan    PT Assessment Patient needs continued PT services  PT Problem List Decreased strength;Decreased mobility;Decreased activity tolerance;Decreased balance;Decreased safety awareness       PT Treatment Interventions Gait training;Stair training;Functional mobility training;Balance training;Therapeutic exercise;Therapeutic activities;Patient/family education    PT Goals (Current goals can be found in the Care Plan section)  Acute Rehab PT Goals Patient Stated Goal: Feel better and get home PT Goal Formulation: With patient Time For Goal Achievement: 04/10/16 Potential to Achieve Goals: Good    Frequency Min 3X/week   Barriers to discharge        Co-evaluation               End of Session Equipment Utilized During Treatment: Gait belt Activity Tolerance: Patient limited by fatigue;Patient tolerated treatment well Patient left: in chair;with chair alarm set;with family/visitor present;with call bell/phone within reach Nurse Communication: Mobility status PT Visit Diagnosis: Other abnormalities of gait and mobility (R26.89);Muscle weakness (generalized) (M62.81)         Time: 8588-5027 PT Time Calculation (min) (ACUTE ONLY): 18 min   Charges:   PT Evaluation $PT Eval Low Complexity: 1 Procedure     PT G Codes:       Enis Gash, SPT Office-[phone]  Mabeline Caras 03/27/2016, 2:00 PM

## 2016-03-28 ENCOUNTER — Inpatient Hospital Stay (HOSPITAL_COMMUNITY): Payer: Medicare Other

## 2016-03-28 LAB — CBC
HCT: 30.3 % — ABNORMAL LOW (ref 39.0–52.0)
Hemoglobin: 10.1 g/dL — ABNORMAL LOW (ref 13.0–17.0)
MCH: 36.6 pg — ABNORMAL HIGH (ref 26.0–34.0)
MCHC: 33.3 g/dL (ref 30.0–36.0)
MCV: 109.8 fL — ABNORMAL HIGH (ref 78.0–100.0)
PLATELETS: 282 10*3/uL (ref 150–400)
RBC: 2.76 MIL/uL — ABNORMAL LOW (ref 4.22–5.81)
RDW: 15.8 % — AB (ref 11.5–15.5)
WBC: 9.2 10*3/uL (ref 4.0–10.5)

## 2016-03-28 LAB — COMPREHENSIVE METABOLIC PANEL
ALK PHOS: 56 U/L (ref 38–126)
ALT: 71 U/L — AB (ref 17–63)
AST: 43 U/L — AB (ref 15–41)
Albumin: 3.5 g/dL (ref 3.5–5.0)
Anion gap: 13 (ref 5–15)
BILIRUBIN TOTAL: 1.7 mg/dL — AB (ref 0.3–1.2)
BUN: 58 mg/dL — ABNORMAL HIGH (ref 6–20)
CALCIUM: 8.1 mg/dL — AB (ref 8.9–10.3)
CO2: 18 mmol/L — ABNORMAL LOW (ref 22–32)
CREATININE: 2.13 mg/dL — AB (ref 0.61–1.24)
Chloride: 107 mmol/L (ref 101–111)
GFR calc Af Amer: 30 mL/min — ABNORMAL LOW (ref >60)
GFR, EST NON AFRICAN AMERICAN: 26 mL/min — AB (ref >60)
Glucose, Bld: 95 mg/dL (ref 65–99)
Potassium: 5 mmol/L (ref 3.5–5.1)
Sodium: 138 mmol/L (ref 135–145)
TOTAL PROTEIN: 5.7 g/dL — AB (ref 6.5–8.1)

## 2016-03-28 LAB — AMYLASE: Amylase: 57 U/L (ref 28–100)

## 2016-03-28 LAB — LIPASE, BLOOD: LIPASE: 19 U/L (ref 11–51)

## 2016-03-28 MED ORDER — HYDROCODONE-ACETAMINOPHEN 5-325 MG PO TABS
1.0000 | ORAL_TABLET | Freq: Four times a day (QID) | ORAL | Status: AC | PRN
  Administered 2016-03-28 – 2016-03-29 (×2): 1 via ORAL
  Filled 2016-03-28 (×2): qty 1

## 2016-03-28 MED ORDER — FENTANYL CITRATE (PF) 100 MCG/2ML IJ SOLN
12.5000 ug | INTRAMUSCULAR | Status: DC | PRN
  Administered 2016-03-28: 12.5 ug via INTRAVENOUS
  Filled 2016-03-28: qty 2

## 2016-03-28 MED ORDER — FUROSEMIDE 10 MG/ML IJ SOLN
40.0000 mg | Freq: Two times a day (BID) | INTRAMUSCULAR | Status: DC
  Administered 2016-03-28 – 2016-03-30 (×4): 40 mg via INTRAVENOUS
  Filled 2016-03-28 (×4): qty 4

## 2016-03-28 MED FILL — Nitroglycerin IV Soln 100 MCG/ML in D5W: INTRA_ARTERIAL | Qty: 10 | Status: AC

## 2016-03-28 NOTE — Progress Notes (Signed)
Physical Therapy Treatment Patient Details Name: Joe Soto MRN: [id number] DOB: 19-Apr-1923 Today's Date: 03/28/2016    History of Present Illness Pt is a 83 y.o. male with history of HTN, CAD status post stenting last one in August 2017, ischemic cardiomyopathy, COPD, CKD stage III and anemia presented to ER 03/25/16 because of chest and abdominal discomfort; his troponin was positive for non-ST elevation MI. Other pertinent PMH includes CVA, asthma, HLD, cancer, arthritis. Cardiac cath performed 3/12.     PT Comments    Pt initially declining to ambulate but willing to participate with encouragement. Pt did ambulate 100 ft no device except using IV pole last 25 ft. Mild instability noted but no loss of balance. Spouse present and supportive throughout session. PT to continue to follow to progress mobility in anticipation of D/C to home once medically ready. Pt and spouse in agreement.    Follow Up Recommendations  Home health PT     Equipment Recommendations  None recommended by PT    Recommendations for Other Services       Precautions / Restrictions Precautions Precautions: Fall Restrictions Weight Bearing Restrictions: No    Mobility  Bed Mobility               General bed mobility comments: sitting EOB upon arrival  Transfers Overall transfer level: Needs assistance Equipment used: None Transfers: Sit to/from Stand Sit to Stand: Supervision         General transfer comment: good stability with standing  Ambulation/Gait Ambulation/Gait assistance: Min guard Ambulation Distance (Feet): 100 Feet Assistive device:  (holding IV pole last 25 ft, otherwise no device) Gait Pattern/deviations: Step-through pattern Gait velocity: Decreased   General Gait Details: Mild instability during ambulation but no loss of balance. Pt resting hand on IV pole last 25 ft. Pt refusing to trail use of rw or SPC.    Stairs            Wheelchair Mobility     Modified Rankin (Stroke Patients Only)       Balance Overall balance assessment: Needs assistance Sitting-balance support: No upper extremity supported;Feet supported Sitting balance-Leahy Scale: Fair     Standing balance support: No upper extremity supported;During functional activity Standing balance-Leahy Scale: Fair Standing balance comment: no device during short ambulation, using IV pole with fatigue                    Cognition Arousal/Alertness: Awake/alert Behavior During Therapy: WFL for tasks assessed/performed Overall Cognitive Status: Within Functional Limits for tasks assessed                      Exercises      General Comments General comments (skin integrity, edema, etc.): HR elevating to 70s during ambulation.       Pertinent Vitals/Pain Pain Assessment: Faces Faces Pain Scale: Hurts little more Pain Location: abdomen Pain Descriptors / Indicators: Sore Pain Intervention(s): Monitored during session    Home Living                      Prior Function            PT Goals (current goals can now be found in the care plan section) Acute Rehab PT Goals Patient Stated Goal: Get home PT Goal Formulation: With patient Time For Goal Achievement: 04/10/16 Potential to Achieve Goals: Good Progress towards PT goals: Progressing toward goals    Frequency    Min 3X/week  PT Plan Current plan remains appropriate    Co-evaluation             End of Session Equipment Utilized During Treatment: Gait belt Activity Tolerance: Patient limited by fatigue Patient left: in chair;with call bell/phone within reach;with family/visitor present Nurse Communication: Mobility status PT Visit Diagnosis: Other abnormalities of gait and mobility (R26.89);Muscle weakness (generalized) (M62.81)     Time: 7340-3709 PT Time Calculation (min) (ACUTE ONLY): 22 min  Charges:  $Gait Training: 8-22 mins                    G Codes:        Cassell Clement, PT, CSCS Pager [phone] Office [phone]  03/28/2016, 9:47 AM

## 2016-03-28 NOTE — Progress Notes (Addendum)
PROGRESS NOTE    Joe Soto  ZHY:[id number] DOB: [birth year] DOA: 03/25/2016 PCP: Limmie Patricia, MD     Brief Narrative:  Joe Soto is a 83 y.o.malewith history of CAD status post stenting in August 2017, ischemic cardiomyopathy, COPD, chronic kidney disease stage III and anemia presented to the ER because of chest and abdominal discomfort. Cardiology consulted.  Assessment & Plan:   Principal Problem:   Non-ST elevation (NSTEMI) myocardial infarction [facility]) Active Problems:   Essential hypertension   Chronic obstructive airway disease with asthma (HCC)   S/P PTCA (percutaneous transluminal coronary angioplasty)   Cardiomyopathy, ischemic   Chest pain   NSTEMI -Cardiology following -S/p cardiac cath 3/12 showed very complex and coronary intervention probably is not a good option in view of severe anemia, renal insufficiency and diffusely diseased and calcified RCA. -Continue aspirin, plavix  Acute on chronic combined systolic and diastolic heart failure/ Ischemic cardiomyopathy -Echo with EF 29-52%, grade 3 diastolic dysfunction  -Continue atenolol, lasix  -Strict I/Os   Acute hypoxemic respiratory failure -Secondary to CHF, bilateral pleural effusions -Spoke with Dr. Einar Gip today. Will start lasix IV BID and continue to monitor BMP closely   AKI on CKD stage 3 -Baseline Cr 1.3  -Worsened in setting of diuretic and contrast  -Trend BMP daily   Abdominal pain -RUQ [place] with gallstones without cholecystitis  -CT abdomen with bilateral renal cysts unable to characterize without IV contrast -?ischemic bowel. Unfortunately poor candidate for any further CTA or contrast studies due to renal dysfunction  -Will hold crestor in setting of elevated liver enzymes  Hypothyroidism -Continue Synthroid -Elevated TSH with normal free T4. Recommend repeating study as outpatient in 4-6 weeks   Hypertension -Continue hydralazine, atenolol  COPD -No  exacerbation  Chronic macrocytic anemia, due to chronic disease  -Baseline Hgb around 8.8 -Iron studies, folate, vit B 12 within normal limits  -Monitor    DVT prophylaxis: subq hep Code Status: Full Family Communication: wife at bedside Disposition Plan: pending improvement, HHPT    Consultants:   Cardiology  Procedures:   Heart cath 3/12   Antimicrobials:   None    Subjective: Patient states that he is feeling well this morning. He had no complaints of chest pain, shortness of breath or abdominal pain on my examination. However, I did note that patient had complaints of severe abdominal pain on cardiology examination this morning. I stopped by again later for second examination. Patient again denies to me any abdominal pain. Per wife, patient has been intermittently confused during this hospitalization. No formal diagnosis of dementia in the past.   Objective: Vitals:   03/27/16 1614 03/27/16 2200 03/28/16 0558 03/28/16 0854  BP: (!) 114/54 (!) 125/55 140/62 133/75  Pulse: (!) 55 86 60   Resp: 16  18   Temp: 97.6 F (36.4 C) 98.8 F (37.1 C) 98.6 F (37 C)   TempSrc: Oral Oral Oral   SpO2: 91% 94% 93%   Weight:   69.3 kg (152 lb 11.2 oz)   Height:        Intake/Output Summary (Last 24 hours) at 03/28/16 1248 Last data filed at 03/28/16 0900  Gross per 24 hour  Intake           1182.5 ml  Output              100 ml  Net           1082.5 ml   Filed Weights   03/26/16 0500  03/27/16 0622 03/28/16 0558  Weight: 70.6 kg (155 lb 11.2 oz) 67.7 kg (149 lb 4.8 oz) 69.3 kg (152 lb 11.2 oz)    Examination:  General exam: Appears calm and comfortable  Respiratory system: Clear to auscultation. Respiratory effort normal. Cardiovascular system: S1 & S2 heard, RRR. No JVD, murmurs, rubs, gallops or clicks. No pedal edema. Gastrointestinal system: Abdomen is nondistended, soft and nontender. No organomegaly or masses felt. Normal bowel sounds heard. Benign abdominal  exam this morning on two separate examinations  Central nervous system: Alert and oriented, although intermittently confused. No focal neurological deficits. Extremities: Symmetric 5 x 5 power. Skin: No rashes, lesions or ulcers  Data Reviewed: I have personally reviewed following labs and imaging studies  CBC:  Recent Labs Lab 03/25/16 1812 03/26/16 0314 03/27/16 0307 03/28/16 0404  WBC 6.3 5.4 7.0 9.2  HGB 9.6* 8.5* 9.5* 10.1*  HCT 29.5* 25.5* 29.1* 30.3*  MCV 108.5* 107.6* 109.8* 109.8*  PLT 325 286 300 448   Basic Metabolic Panel:  Recent Labs Lab 03/25/16 1812 03/26/16 0806 03/27/16 0307 03/28/16 0404  NA 139 141 139 138  K 4.6 4.2 4.8 5.0  CL 107 107 108 107  CO2 22 19* 20* 18*  GLUCOSE 131* 109* 122* 95  BUN 36* 39* 43* 58*  CREATININE 1.49* 1.57* 1.92* 2.13*  CALCIUM 9.2 8.6* 8.5* 8.1*   GFR: Estimated Creatinine Clearance: 22.6 mL/min (by C-G formula based on SCr of 2.13 mg/dL (H)). Liver Function Tests:  Recent Labs Lab 03/25/16 1812 03/27/16 0307 03/28/16 0404  AST 27 57* 43*  ALT 20 63 71*  ALKPHOS 57 55 56  BILITOT 1.5* 2.3* 1.7*  PROT 6.6 6.0* 5.7*  ALBUMIN 3.9 3.8 3.5    Recent Labs Lab 03/25/16 1812 03/28/16 0404  LIPASE 20 19  AMYLASE  --  57   No results for input(s): AMMONIA in the last 168 hours. Coagulation Profile:  Recent Labs Lab 03/26/16 0314  INR 1.61   Cardiac Enzymes:  Recent Labs Lab 03/25/16 2214 03/26/16 0314 03/26/16 0806  TROPONINI 1.25* 1.21* 0.88*   BNP (last 3 results) No results for input(s): PROBNP in the last 8760 hours. HbA1C:  Recent Labs  03/26/16 0632  HGBA1C 5.2   CBG: No results for input(s): GLUCAP in the last 168 hours. Lipid Profile:  Recent Labs  03/26/16 1135  CHOL 116  HDL 28*  LDLCALC 77  TRIG 56  CHOLHDL 4.1   Thyroid Function Tests:  Recent Labs  03/26/16 1135 03/27/16 1436  TSH 20.879*  --   FREET4  --  0.74   Anemia Panel:  Recent Labs   03/26/16 0632  VITAMINB12 222  FOLATE 23.0  FERRITIN 344*  TIBC 249*  IRON 66  RETICCTPCT 2.2   Sepsis Labs: No results for input(s): PROCALCITON, LATICACIDVEN in the last 168 hours.  Recent Results (from the past 240 hour(s))  MRSA PCR Screening     Status: None   Collection Time: 03/25/16 10:24 PM  Result Value Ref Range Status   MRSA by PCR NEGATIVE NEGATIVE Final    Comment:        The GeneXpert MRSA Assay (FDA approved for NASAL specimens only), is one component of a comprehensive MRSA colonization surveillance program. It is not intended to diagnose MRSA infection nor to guide or monitor treatment for MRSA infections.        Radiology Studies: US Abdomen Complete  Result Date: 03/27/2016 CLINICAL DATA:  Elevated liver function tests. Chronic  kidney disease. EXAM: ABDOMEN ULTRASOUND COMPLETE COMPARISON:  05/25/2009 abdominopelvic CT. FINDINGS: Gallbladder: 11 mm gallstone. This is nonmobile. No wall thickening or pericholecystic fluid. Sonographic Murphy's sign was not elicited. Common bile duct: Diameter: Normal, 4 mm. Liver: No focal lesion identified. Within normal limits in parenchymal echogenicity. IVC: No abnormality visualized. Pancreas: Visualized portion unremarkable. Spleen: Size and appearance within normal limits. Right Kidney: Length: 10.0 cm. No hydronephrosis. Multiple renal cysts. The largest is in the lower pole and measures maximally 11.8 cm. Left Kidney: Length: 9.8 cm. No hydronephrosis. Multiple renal cysts. The largest is in the upper pole and measures 3.7 x 5.1 x 4.0 cm cm. There may be mild complexity involving upper pole left renal lesion of 3.2 x 3.4 x 3.6 cm. Abdominal aorta: Aortic ectasia of maximally 3.7 cm. Extensive atherosclerosis within. Other findings: Bilateral pleural effusions.  No ascites. IMPRESSION: 1. Cholelithiasis without acute cholecystitis or biliary duct dilatation. 2. Aortic atherosclerosis and mild ectasia. 3. Bilateral pleural  effusions. Electronically Signed   By: Abigail Miyamoto M.D.   On: 03/27/2016 20:04      Scheduled Meds: . aspirin  81 mg Oral Daily  . atenolol  50 mg Oral Daily  . clopidogrel  75 mg Oral Daily  . famotidine  40 mg Oral Daily  . furosemide  40 mg Oral Daily  . heparin  5,000 Units Subcutaneous Q8H  . hydrALAZINE  25 mg Oral Daily  . levothyroxine  75 mcg Oral QAC breakfast  . multivitamin with minerals  1 tablet Oral Daily  . sodium chloride flush  3 mL Intravenous Q12H   Continuous Infusions: . sodium chloride 50 mL/hr at 03/27/16 1557     LOS: 2 days    Time spent: 40 minutes   Dessa Phi, DO [facility] [email or web address] Password [facility] 03/28/2016, 12:48 PM

## 2016-03-28 NOTE — Plan of Care (Signed)
Problem: Pain Managment: Goal: General experience of comfort will improve Outcome: Progressing Pt was given fentanyl earlier in the shift & has not complained of any pain since.

## 2016-03-28 NOTE — Progress Notes (Signed)
MD notified that upon routine VS check pt was noted to have a O2 sat ranging from 83-86% on RA. Pt in no apparent distress. Pt placed on 2L [place] & O2 sats increased to range from 92-96%. Will continue to monitor the pt. Hoover Brunette, RN

## 2016-03-29 LAB — CBC
HCT: 27.5 % — ABNORMAL LOW (ref 39.0–52.0)
Hemoglobin: 9 g/dL — ABNORMAL LOW (ref 13.0–17.0)
MCH: 35.9 pg — AB (ref 26.0–34.0)
MCHC: 32.7 g/dL (ref 30.0–36.0)
MCV: 109.6 fL — AB (ref 78.0–100.0)
PLATELETS: 250 10*3/uL (ref 150–400)
RBC: 2.51 MIL/uL — ABNORMAL LOW (ref 4.22–5.81)
RDW: 15.9 % — AB (ref 11.5–15.5)
WBC: 8.5 10*3/uL (ref 4.0–10.5)

## 2016-03-29 LAB — COMPREHENSIVE METABOLIC PANEL
ALT: 72 U/L — AB (ref 17–63)
AST: 38 U/L (ref 15–41)
Albumin: 3.1 g/dL — ABNORMAL LOW (ref 3.5–5.0)
Alkaline Phosphatase: 54 U/L (ref 38–126)
Anion gap: 7 (ref 5–15)
BILIRUBIN TOTAL: 1.8 mg/dL — AB (ref 0.3–1.2)
BUN: 64 mg/dL — AB (ref 6–20)
CO2: 21 mmol/L — ABNORMAL LOW (ref 22–32)
Calcium: 7.8 mg/dL — ABNORMAL LOW (ref 8.9–10.3)
Chloride: 111 mmol/L (ref 101–111)
Creatinine, Ser: 2.22 mg/dL — ABNORMAL HIGH (ref 0.61–1.24)
GFR calc Af Amer: 28 mL/min — ABNORMAL LOW (ref >60)
GFR, EST NON AFRICAN AMERICAN: 24 mL/min — AB (ref >60)
Glucose, Bld: 118 mg/dL — ABNORMAL HIGH (ref 65–99)
POTASSIUM: 4.5 mmol/L (ref 3.5–5.1)
Sodium: 139 mmol/L (ref 135–145)
TOTAL PROTEIN: 5.3 g/dL — AB (ref 6.5–8.1)

## 2016-03-29 NOTE — Progress Notes (Signed)
Physical Therapy Treatment Patient Details Name: Joe Soto MRN: [id number] DOB: [birth year] Today's Date: 03/29/2016    History of Present Illness Pt is a 83 y.o. male with history of HTN, CAD status post stenting last one in August 2017, ischemic cardiomyopathy, COPD, CKD stage III and anemia presented to ER 03/25/16 because of chest and abdominal discomfort; his troponin was positive for non-ST elevation MI. Other pertinent PMH includes CVA, asthma, HLD, cancer, arthritis. Cardiac cath performed 3/12.     PT Comments    Pt continuing to need encouragement to participate with mobility. O2 sats staying in 90s during ambulation and pt denying and SOB. Pt requested trial of rw for ambulation which did provide additional stability when going straight but pt with poor safety awareness and requiring physical assistance when turning. Upon return to room, discussed with family need for assistance with ambulation for safety upon returning home. Family confirms understanding. PT to continue to follow in anticipation of D/C to home with family support.   Follow Up Recommendations  Home health PT     Equipment Recommendations  None recommended by PT    Recommendations for Other Services       Precautions / Restrictions Precautions Precautions: Fall Restrictions Weight Bearing Restrictions: No    Mobility  Bed Mobility               General bed mobility comments: sitting EOB upon arrival  Transfers Overall transfer level: Needs assistance Equipment used: None Transfers: Sit to/from Stand Sit to Stand: Min guard         General transfer comment: mild instability with standing  Ambulation/Gait Ambulation/Gait assistance: Min guard Ambulation Distance (Feet): 100 Feet (plus 10 ft in room without device) Assistive device: Rolling walker (2 wheeled) Gait Pattern/deviations: Step-through pattern Gait velocity: Decreased   General Gait Details: Pt requesting use of rw  today, needing repeated cues for safety and occasional assist needed to maneuver rw.    Stairs            Wheelchair Mobility    Modified Rankin (Stroke Patients Only)       Balance Overall balance assessment: Needs assistance Sitting-balance support: No upper extremity supported;Feet supported Sitting balance-Leahy Scale: Fair     Standing balance support: No upper extremity supported;During functional activity Standing balance-Leahy Scale: Fair Standing balance comment: no device for short ambulation                    Cognition Arousal/Alertness: Awake/alert Behavior During Therapy: WFL for tasks assessed/performed Overall Cognitive Status: Impaired/Different from baseline Area of Impairment: Safety/judgement;Awareness         Safety/Judgement: Decreased awareness of safety     General Comments: Pt with mild confusion, needing occasional orientation to task and multiple cues regarding safety. Pt discussing unrelated topics during conversations related to chemistry and oxygen molecules.     Exercises      General Comments General comments (skin integrity, edema, etc.): SpO2 96% on RA prior to ambulation, dropping to low 90s during ambulation.       Pertinent Vitals/Pain Pain Assessment: Faces Faces Pain Scale: Hurts a little bit Pain Location: abdomen Pain Descriptors / Indicators: Sore Pain Intervention(s): Monitored during session    Home Living                      Prior Function            PT Goals (current goals can now be found  in the care plan section) Acute Rehab PT Goals Patient Stated Goal: Get home PT Goal Formulation: With patient Time For Goal Achievement: 04/10/16 Potential to Achieve Goals: Good Progress towards PT goals: Progressing toward goals    Frequency    Min 3X/week      PT Plan Current plan remains appropriate    Co-evaluation             End of Session Equipment Utilized During  Treatment: Gait belt Activity Tolerance: Patient limited by fatigue Patient left: in chair;with call bell/phone within reach;with family/visitor present Nurse Communication: Mobility status PT Visit Diagnosis: Other abnormalities of gait and mobility (R26.89);Muscle weakness (generalized) (M62.81)     Time: 1000-1017 PT Time Calculation (min) (ACUTE ONLY): 17 min  Charges:  $Gait Training: 8-22 mins                    G Codes:       Cassell Clement, PT, CSCS Pager [phone] Office [phone]  03/29/2016, 10:35 AM

## 2016-03-29 NOTE — Progress Notes (Signed)
Subjective:  C/O abdominal pain that is still present today, but slightly better. Had bowel movement, no hematochezia or melena. Wife and son present the bedside. No PND or orthopnea. Slight left leg lower extremity edema. No decreased urinary output, diuresing well.  Objective:  Vital Signs in the last 24 hours: Temp:  [97.6 F (36.4 C)-98.6 F (37 C)] 98 F (36.7 C) (03/15 1555) Pulse Rate:  [50-66] 50 (03/15 1452) Resp:  [18] 18 (03/14 2137) BP: (103-124)/(49-74) 112/74 (03/15 1555) SpO2:  [92 %-100 %] 96 % (03/15 1555) Weight:  [153 lb 8 oz (69.6 kg)] 153 lb 8 oz (69.6 kg) (03/15 0300)  Intake/Output from previous day: 03/14 0701 - 03/15 0700 In: 660 [P.O.:660] Out: 400 [Urine:400]  Physical Exam:  General appearance: alert, cooperative, appears stated age and mild distress Eyes: negative findings: lids and lashes normal Neck: no adenopathy, no carotid bruit, supple, symmetrical, trachea midline, thyroid not enlarged, symmetric, no tenderness/mass/nodules and JVD slightly up Neck: JVP - normal, carotids 2+= without bruits Resp: clear to auscultation bilaterally Chest wall: no tenderness Cardio: regular rate and rhythm, S1, S2 normal, no murmur, click, rub or gallop and Distant heart sounds. Frequent ectopy noted GI: Abdomen is soft, diffusely tender throughout, no focal abnormality, no rebound tenderness. Extremities: normal, no cyanosis or ulcerations. +1 pitting edema noted on left lower extremity. No edema on the right lower extremity. DP and PT pulses 2+.  Lab Results: BMP  Recent Labs  03/27/16 0307 03/28/16 0404 03/29/16 0300  NA 139 138 139  K 4.8 5.0 4.5  CL 108 107 111  CO2 20* 18* 21*  GLUCOSE 122* 95 118*  BUN 43* 58* 64*  CREATININE 1.92* 2.13* 2.22*  CALCIUM 8.5* 8.1* 7.8*  GFRNONAA 29* 26* 24*  GFRAA 34* 30* 28*    CBC  Recent Labs Lab 03/29/16 0300  WBC 8.5  RBC 2.51*  HGB 9.0*  HCT 27.5*  PLT 250  MCV 109.6*  MCH 35.9*  MCHC 32.7   RDW 15.9*    HEMOGLOBIN A1C Lab Results  Component Value Date   HGBA1C 5.2 03/26/2016   MPG 103 03/26/2016   Cardiac Panel (last 3 results)  Recent Labs  03/25/16 2214 03/26/16 0314 03/26/16 0806  TROPONINI 1.25* 1.21* 0.88*   TSH  Recent Labs  03/26/16 1135  TSH 20.879*    CHOLESTEROL  Recent Labs  03/26/16 1135  CHOL 116    Hepatic Function Panel  Recent Labs  03/25/16 1812 03/27/16 0307 03/28/16 0404 03/29/16 0300  PROT 6.6 6.0* 5.7* 5.3*  ALBUMIN 3.9 3.8 3.5 3.1*  AST 27 57* 43* 38  ALT 20 63 71* 72*  ALKPHOS 57 55 56 54  BILITOT 1.5* 2.3* 1.7* 1.8*  BILIDIR 0.3  --   --   --   IBILI 1.2*  --   --   --    Scheduled Meds: . aspirin  81 mg Oral Daily  . atenolol  50 mg Oral Daily  . clopidogrel  75 mg Oral Daily  . famotidine  40 mg Oral Daily  . furosemide  40 mg Intravenous BID  . heparin  5,000 Units Subcutaneous Q8H  . hydrALAZINE  25 mg Oral Daily  . levothyroxine  75 mcg Oral QAC breakfast  . multivitamin with minerals  1 tablet Oral Daily  . sodium chloride flush  3 mL Intravenous Q12H   Continuous Infusions: PRN Meds:.sodium chloride, acetaminophen, albuterol, fentaNYL (SUBLIMAZE) injection, ondansetron (ZOFRAN) IV, sodium chloride flush  Imaging: Imaging results have been reviewed  Cardiac Studies:  Coronary angiogram 03/26/2016, markedly elevated LVEDP. High-grade stenosis of the PDA branch of the right coronary artery. Previously placed stent in the mid RCA and LAD widely patent. Medical therapy.  Echocardiogram 03/26/2016: Severe LV systolic dysfunction, anterolateral hypokinesis, restrictive physiology. moderate to severe mitral regurgitation. Moderate tricuspid regurgitation. Moderate pulmonary hypertension.  EKG 03/25/2016: Normal sinus rhythm at a rate of 67 beats a minute, normal axis. Anterior infarct old. Lateral ischemia. Compared to prior EKG, lateral ischemia is new.   Assessment/Plan:  1. Acute on chronic  systolic and diastolic heart failure 2. Acute on Chronic renal insufficiency stage III due to acute on chronic systolic and diastolic heart failure, contrast exposure. 3. Non-ST elevation myocardial infarction, probably related to small vessel disease. CAD, medical therapy 4. Chronic anemia suspect anemia of chronic disease, anemia panel unyielding. 5. Hypertension 6. Hyperlipidemia 7. Abdominal discomfort, diffuse and nonspecific, CT of abdomen shows bilateral renal cysts.   Recommendation: CT of abdomen shows bilateral renal cysts.  He continues to have abdominal pain, but he reports slightly improved today.  Continue with Fentanyl IV push when necessary for pain.  He is currently on Lasix 40 mg twice a day, diuresing well.  He does have left lower extremity edema.  We will continue to monitor, Creatinine is stable. If remains stable, will change lasix to PO in the morning. Will obtain BNP today and in the morning.    Miquel Dunn, FNP-C. 03/29/2016, 5:24 PM [facility] Pager: [phone] Office: [phone] If no answer: [phone]

## 2016-03-29 NOTE — Progress Notes (Signed)
PROGRESS NOTE    Joe Soto  KXF:[id number] DOB: 1923-07-26 DOA: 03/25/2016 PCP: Limmie Patricia, MD     Brief Narrative:  Joe Soto is a 83 y.o.malewith history of CAD status post stenting in August 2017, ischemic cardiomyopathy, COPD, chronic kidney disease stage III and anemia presented to the ER because of chest and abdominal discomfort. Cardiology consulted and he underwent heart cath on 3/12.   Assessment & Plan:   Principal Problem:   Non-ST elevation (NSTEMI) myocardial infarction [facility]) Active Problems:   Essential hypertension   Chronic obstructive airway disease with asthma (HCC)   S/P PTCA (percutaneous transluminal coronary angioplasty)   Cardiomyopathy, ischemic   Chest pain   NSTEMI -Cardiology following -S/p cardiac cath 3/12 showed very complex and coronary intervention probably is not a good option in view of severe anemia, renal insufficiency and diffusely diseased and calcified RCA. -Continue aspirin, plavix  Acute on chronic combined systolic and diastolic heart failure/ Ischemic cardiomyopathy -Echo with EF 69-67%, grade 3 diastolic dysfunction  -Continue atenolol, lasix  -Strict I/Os   Acute hypoxemic respiratory failure -Secondary to CHF, bilateral pleural effusions  AKI on CKD stage 3 -Baseline Cr 1.3  -Worsened in setting of diuretic and contrast  -Trend BMP daily   Abdominal pain -RUQ [place] with gallstones without cholecystitis  -CT abdomen with bilateral renal cysts unable to characterize without IV contrast -?mesenteric ischemia. Unfortunately poor candidate for any further CTA or contrast studies due to renal dysfunction  -Will hold crestor in setting of elevated liver enzymes -Supportive care for now, continue pain control, able to tolerate meals   Hypothyroidism -Continue Synthroid -Elevated TSH with normal free T4. Recommend repeating study as outpatient in 4-6 weeks   Hypertension -Continue hydralazine,  atenolol  COPD -No exacerbation  Chronic macrocytic anemia, due to chronic disease  -Baseline Hgb around 8.8 -Iron studies, folate, vit B 12 within normal limits  -Monitor    DVT prophylaxis: subq hep Code Status: Full Family Communication: wife, son at bedside Disposition Plan: pending improvement, HHPT    Consultants:   Cardiology  Procedures:   Heart cath 3/12   Antimicrobials:   None    Subjective: Patient states that he is feeling well this morning. He had no complaints of chest pain, shortness of breath or abdominal pain on my examination today, although family does report that he has had intermittent severe abdominal pain. He has been having bowel movements without blood, he has been tolerating meals without nausea, vomiting.    Objective: Vitals:   03/28/16 2137 03/29/16 0300 03/29/16 0816 03/29/16 0822  BP: (!) 120/53 103/63 124/69   Pulse:   64 66  Resp: 18     Temp: 97.7 F (36.5 C) 97.6 F (36.4 C)    TempSrc: Axillary Axillary    SpO2: 100% 92%  93%  Weight:  69.6 kg (153 lb 8 oz)    Height:        Intake/Output Summary (Last 24 hours) at 03/29/16 1143 Last data filed at 03/29/16 8938  Gross per 24 hour  Intake              780 ml  Output              400 ml  Net              380 ml   Filed Weights   03/27/16 0622 03/28/16 0558 03/29/16 0300  Weight: 67.7 kg (149 lb 4.8 oz) 69.3 kg (152 lb  11.2 oz) 69.6 kg (153 lb 8 oz)    Examination:  General exam: Appears calm and comfortable  Respiratory system: Clear to auscultation. Respiratory effort normal. Cardiovascular system: S1 & S2 heard, RRR. No JVD, murmurs, rubs, gallops or clicks. No pedal edema. Gastrointestinal system: Abdomen is nondistended, soft and nontender. No organomegaly or masses felt. Normal bowel sounds heard. Benign abdominal exam this morning  Central nervous system: Alert and oriented, although intermittently confused. No focal neurological deficits. Extremities:  Symmetric 5 x 5 power. Skin: No rashes, lesions or ulcers  Data Reviewed: I have personally reviewed following labs and imaging studies  CBC:  Recent Labs Lab 03/25/16 1812 03/26/16 0314 03/27/16 0307 03/28/16 0404 03/29/16 0300  WBC 6.3 5.4 7.0 9.2 8.5  HGB 9.6* 8.5* 9.5* 10.1* 9.0*  HCT 29.5* 25.5* 29.1* 30.3* 27.5*  MCV 108.5* 107.6* 109.8* 109.8* 109.6*  PLT 325 286 300 282 229   Basic Metabolic Panel:  Recent Labs Lab 03/25/16 1812 03/26/16 0806 03/27/16 0307 03/28/16 0404 03/29/16 0300  NA 139 141 139 138 139  K 4.6 4.2 4.8 5.0 4.5  CL 107 107 108 107 111  CO2 22 19* 20* 18* 21*  GLUCOSE 131* 109* 122* 95 118*  BUN 36* 39* 43* 58* 64*  CREATININE 1.49* 1.57* 1.92* 2.13* 2.22*  CALCIUM 9.2 8.6* 8.5* 8.1* 7.8*   GFR: Estimated Creatinine Clearance: 21.8 mL/min (A) (by C-G formula based on SCr of 2.22 mg/dL (H)). Liver Function Tests:  Recent Labs Lab 03/25/16 1812 03/27/16 0307 03/28/16 0404 03/29/16 0300  AST 27 57* 43* 38  ALT 20 63 71* 72*  ALKPHOS 57 55 56 54  BILITOT 1.5* 2.3* 1.7* 1.8*  PROT 6.6 6.0* 5.7* 5.3*  ALBUMIN 3.9 3.8 3.5 3.1*    Recent Labs Lab 03/25/16 1812 03/28/16 0404  LIPASE 20 19  AMYLASE  --  57   No results for input(s): AMMONIA in the last 168 hours. Coagulation Profile:  Recent Labs Lab 03/26/16 0314  INR 1.61   Cardiac Enzymes:  Recent Labs Lab 03/25/16 2214 03/26/16 0314 03/26/16 0806  TROPONINI 1.25* 1.21* 0.88*   BNP (last 3 results) No results for input(s): PROBNP in the last 8760 hours. HbA1C: No results for input(s): HGBA1C in the last 72 hours. CBG: No results for input(s): GLUCAP in the last 168 hours. Lipid Profile: No results for input(s): CHOL, HDL, LDLCALC, TRIG, CHOLHDL, LDLDIRECT in the last 72 hours. Thyroid Function Tests:  Recent Labs  03/27/16 1436  FREET4 0.74   Anemia Panel: No results for input(s): VITAMINB12, FOLATE, FERRITIN, TIBC, IRON, RETICCTPCT in the last 72  hours. Sepsis Labs: No results for input(s): PROCALCITON, LATICACIDVEN in the last 168 hours.  Recent Results (from the past 240 hour(s))  MRSA PCR Screening     Status: None   Collection Time: 03/25/16 10:24 PM  Result Value Ref Range Status   MRSA by PCR NEGATIVE NEGATIVE Final    Comment:        The GeneXpert MRSA Assay (FDA approved for NASAL specimens only), is one component of a comprehensive MRSA colonization surveillance program. It is not intended to diagnose MRSA infection nor to guide or monitor treatment for MRSA infections.        Radiology Studies: Ct Abdomen Wo Contrast  Result Date: 03/28/2016 CLINICAL DATA:  Multiple renal cysts. EXAM: CT ABDOMEN WITHOUT CONTRAST TECHNIQUE: Multidetector CT imaging of the abdomen was performed following the standard protocol without IV contrast. COMPARISON:  None. FINDINGS: Lower  chest: The heart size is enlarged. Aortic atherosclerosis. Calcification in the RCA and LAD and left circumflex coronary artery noted. There are bilateral pleural effusions noted, right greater than left. Overlying compressive type atelectasis is identified. Hepatobiliary: No focal liver abnormality. Stone within the gallbladder measures 11 mm, image 30 of series 201. Pancreas: Unremarkable. No pancreatic ductal dilatation or surrounding inflammatory changes. Spleen: Normal appearance of the spleen Adrenals/Urinary Tract: The adrenal glands appear normal. There are bilateral renal cysts identified of varying complexity which are incompletely characterized without IV contrast. This includes a hyperdense lesion arising from the inferior pole the right kidney measuring 2.2 cm in a small hyperdense lesion arising from the upper pole the left kidney measuring 1.2 cm, image 23 of series 201. No nephrolithiasis or hydronephrosis. Stomach/Bowel: Stomach is within normal limits. Appendix appears normal. No evidence of bowel wall thickening, distention, or inflammatory  changes. Vascular/Lymphatic: Aortic atherosclerosis. Infrarenal abdominal aortic is a ectatic measuring 2.8 cm, image 41 of series 201. No enlarged upper abdominal lymph nodes. Other: No abdominal wall hernia or abnormality. Musculoskeletal: Degenerative disc disease noted within the lower thoracic and lumbar spine. IMPRESSION: 1. Bilateral renal cysts of varying density are incompletely characterized without IV contrast material. Consider more definitive assessment with contrast enhanced MRI of the kidneys. 2. Aortic atherosclerosis. Ectatic abdominal aorta at risk for aneurysm development. Recommend followup by ultrasound in 5 years. This recommendation follows ACR consensus guidelines: White Paper of the ACR Incidental Findings Committee II on Vascular Findings. J Am Coll Radiol 2013; 10:789-794. 3. Gallstone 4. Bilateral pleural effusions, cardiac enlargement, coronary artery calcifications. Electronically Signed   By: Kerby Moors M.D.   On: 03/28/2016 15:07   US Abdomen Complete  Result Date: 03/27/2016 CLINICAL DATA:  Elevated liver function tests. Chronic kidney disease. EXAM: ABDOMEN ULTRASOUND COMPLETE COMPARISON:  05/25/2009 abdominopelvic CT. FINDINGS: Gallbladder: 11 mm gallstone. This is nonmobile. No wall thickening or pericholecystic fluid. Sonographic Murphy's sign was not elicited. Common bile duct: Diameter: Normal, 4 mm. Liver: No focal lesion identified. Within normal limits in parenchymal echogenicity. IVC: No abnormality visualized. Pancreas: Visualized portion unremarkable. Spleen: Size and appearance within normal limits. Right Kidney: Length: 10.0 cm. No hydronephrosis. Multiple renal cysts. The largest is in the lower pole and measures maximally 11.8 cm. Left Kidney: Length: 9.8 cm. No hydronephrosis. Multiple renal cysts. The largest is in the upper pole and measures 3.7 x 5.1 x 4.0 cm cm. There may be mild complexity involving upper pole left renal lesion of 3.2 x 3.4 x 3.6 cm.  Abdominal aorta: Aortic ectasia of maximally 3.7 cm. Extensive atherosclerosis within. Other findings: Bilateral pleural effusions.  No ascites. IMPRESSION: 1. Cholelithiasis without acute cholecystitis or biliary duct dilatation. 2. Aortic atherosclerosis and mild ectasia. 3. Bilateral pleural effusions. Electronically Signed   By: Abigail Miyamoto M.D.   On: 03/27/2016 20:04      Scheduled Meds: . aspirin  81 mg Oral Daily  . atenolol  50 mg Oral Daily  . clopidogrel  75 mg Oral Daily  . famotidine  40 mg Oral Daily  . furosemide  40 mg Intravenous BID  . heparin  5,000 Units Subcutaneous Q8H  . hydrALAZINE  25 mg Oral Daily  . levothyroxine  75 mcg Oral QAC breakfast  . multivitamin with minerals  1 tablet Oral Daily  . sodium chloride flush  3 mL Intravenous Q12H   Continuous Infusions:    LOS: 3 days    Time spent: 30 minutes   Anderson Malta  Maylene Roes, DO [facility] [email or web address] Password [email or web address] 03/29/2016, 11:43 AM

## 2016-03-30 LAB — COMPREHENSIVE METABOLIC PANEL
ALT: 93 U/L — AB (ref 17–63)
AST: 47 U/L — AB (ref 15–41)
Albumin: 3.3 g/dL — ABNORMAL LOW (ref 3.5–5.0)
Alkaline Phosphatase: 57 U/L (ref 38–126)
Anion gap: 11 (ref 5–15)
BUN: 64 mg/dL — ABNORMAL HIGH (ref 6–20)
CHLORIDE: 105 mmol/L (ref 101–111)
CO2: 24 mmol/L (ref 22–32)
CREATININE: 2.04 mg/dL — AB (ref 0.61–1.24)
Calcium: 8.2 mg/dL — ABNORMAL LOW (ref 8.9–10.3)
GFR calc non Af Amer: 27 mL/min — ABNORMAL LOW (ref >60)
GFR, EST AFRICAN AMERICAN: 31 mL/min — AB (ref >60)
GLUCOSE: 119 mg/dL — AB (ref 65–99)
Potassium: 4 mmol/L (ref 3.5–5.1)
Sodium: 140 mmol/L (ref 135–145)
Total Bilirubin: 1.7 mg/dL — ABNORMAL HIGH (ref 0.3–1.2)
Total Protein: 5.8 g/dL — ABNORMAL LOW (ref 6.5–8.1)

## 2016-03-30 LAB — CBC
HCT: 29.6 % — ABNORMAL LOW (ref 39.0–52.0)
Hemoglobin: 9.9 g/dL — ABNORMAL LOW (ref 13.0–17.0)
MCH: 36.4 pg — AB (ref 26.0–34.0)
MCHC: 33.4 g/dL (ref 30.0–36.0)
MCV: 108.8 fL — ABNORMAL HIGH (ref 78.0–100.0)
PLATELETS: 251 10*3/uL (ref 150–400)
RBC: 2.72 MIL/uL — AB (ref 4.22–5.81)
RDW: 16.3 % — ABNORMAL HIGH (ref 11.5–15.5)
WBC: 9.7 10*3/uL (ref 4.0–10.5)

## 2016-03-30 MED ORDER — HYDRALAZINE HCL 25 MG PO TABS: 25.0000 mg | ORAL_TABLET | Freq: Every day | ORAL | 0 refills | Status: AC

## 2016-03-30 MED ORDER — FUROSEMIDE 20 MG PO TABS: 40.0000 mg | ORAL_TABLET | Freq: Every day | ORAL | 0 refills | Status: AC

## 2016-03-30 NOTE — Discharge Summary (Signed)
Physician Discharge Summary  Joe Soto DDU:[id number] DOB: 1923-10-26 DOA: 03/25/2016  PCP: Limmie Patricia, MD  Admit date: 03/25/2016 Discharge date: 03/30/2016  Admitted From: Home Disposition:  Home  Recommendations for Outpatient Follow-up:  1. Follow up with PCP in 1 week 2. Follow up with Cardiology Dr. Einar Gip in 1 week  3. Please obtain BMP/CBC/LFT in 1 week   4. Repeat TSH and T4 in 4-6 weeks   Home Health: PT  Equipment/Devices: None   Discharge Condition: Stable CODE STATUS: Full  Diet recommendation: Heart healthy   Brief/Interim Summary: From H&P: Joe Soto is a 83 y.o. male with history of CAD status post stenting last one in August 2017, ischemic cardiomyopathy, COPD, chronic kidney disease stage III and anemia presented to the ER because of chest and abdominal discomfort. Patient's symptoms started this morning. Pain moved from his abdomen up to his neck. Pressure-like nonradiating no associated nausea vomiting or diarrhea. Patient tried to take his home inhalers despite which patient had persistent discomfort and presented to the ER.   ED Course: EKG in the ER was showing nonspecific findings. Troponin is elevated. Patient's chest and abdominal discomfort improved after patient was started on nitroglycerin infusion. Patient's cardiologist Dr. Einar Gip was consulted.   Interim: Patient underwent heart catheterization on 3/12. This showed very complex and coronary intervention probably is not a good option in view of severe anemia, renal insufficiency. Echo revealed EF 23-76%, grade 3 diastolic dysfunction. Due to contrast and Lasix administration, patient's renal function worsened. This was carefully monitored with Lasix with plateau of creatinine. Patient complained of severe abdominal pain during hospitalization. Right upper quadrant ultrasound showed gallstones without cholecystitis. CT abdomen showed bilateral renal cysts, but otherwise unremarkable to explain  his abdominal pain. Etiology could be mesenteric ischemia. Unfortunately, due to his renal dysfunction, he is a poor candidate with CTA or other contrast studies. His abdominal pain improved. Crestor was held due to his elevated liver enzymes. He was able to tolerate diet, having normal stools.  Subjective on day of discharge: Feeling well today. No complaints, states abdominal pain has improved. Denies CP or SOB.   Discharge Diagnoses:  Principal Problem:   Non-ST elevation (NSTEMI) myocardial infarction [facility]) Active Problems:   Essential hypertension   Chronic obstructive airway disease with asthma (HCC)   S/P PTCA (percutaneous transluminal coronary angioplasty)   Cardiomyopathy, ischemic   Chest pain  NSTEMI -Cardiology following -S/p cardiac cath 3/12 showed very complex and coronary intervention probably is not a good option in view of severe anemia, renal insufficiency and diffusely diseased and calcified RCA. -Continue aspirin, plavix  Acute on chronic combined systolic and diastolic heart failure/ Ischemic cardiomyopathy -Echo with EF 28-31%, grade 3 diastolic dysfunction  -Continue atenolol, lasix. Diuresed with IV lasix, which was switched to PO  -Strict I/Os   Acute hypoxemic respiratory failure -Secondary to CHF, bilateral pleural effusions -Off  O2   AKI on CKD stage 3 -Baseline Cr 1.3  -Worsened in setting of diuretic and contrast  -Improved today, monitor carefully as outpatient   Abdominal pain -RUQ [place] with gallstones without cholecystitis  -CT abdomen with bilateral renal cysts unable to characterize without IV contrast -?mesenteric ischemia. Unfortunately poor candidate for any further CTA or contrast studies due to renal dysfunction  -Will hold crestor in setting of elevated liver enzymes. Repeat LFT as outpatient -Improved pain today   Hypothyroidism -Continue Synthroid -Elevated TSH with normal free T4. Recommend repeating study as outpatient in  4-6 weeks  Hypertension -Continue hydralazine, atenolol  COPD -No exacerbation  Chronic macrocytic anemia, due to chronic disease  -Baseline Hgb around 8.8 -Iron studies, folate, vit B 12 within normal limits  -Monitor    Discharge Instructions  Discharge Instructions    Diet - low sodium heart healthy    Complete by:  As directed    Increase activity slowly    Complete by:  As directed      Allergies as of 03/30/2016      Reactions   Darvon [propoxyphene] Nausea And Vomiting   Pineapple Nausea And Vomiting   Shellfish Allergy Swelling   Shrimp and clams   Codeine Nausea And Vomiting   Meperidine Hcl Nausea And Vomiting   Reaction to demerol      Medication List    STOP taking these medications   ADVAIR HFA 230-21 MCG/ACT inhaler Generic drug:  fluticasone-salmeterol   rosuvastatin 5 MG tablet Commonly known as:  CRESTOR   valsartan 160 MG tablet Commonly known as:  DIOVAN     TAKE these medications   acetaminophen 500 MG tablet Commonly known as:  TYLENOL Take 500 mg by mouth every 6 (six) hours as needed for headache (pain).   aspirin 81 MG chewable tablet Chew 1 tablet (81 mg total) by mouth daily.   atenolol 50 MG tablet Commonly known as:  TENORMIN Take 1 tablet (50 mg total) by mouth daily.   clopidogrel 75 MG tablet Commonly known as:  PLAVIX Take 1 tablet (75 mg total) by mouth daily.   famotidine 40 MG tablet Commonly known as:  PEPCID Take 1 tablet (40 mg total) by mouth daily.   ferrous sulfate 325 (65 FE) MG tablet Commonly known as:  FEOSOL Take 1 tablet (325 mg total) by mouth daily with breakfast.   furosemide 20 MG tablet Commonly known as:  LASIX Take 2 tablets (40 mg total) by mouth daily. What changed:  how much to take   hydrALAZINE 25 MG tablet Commonly known as:  APRESOLINE Take 1 tablet (25 mg total) by mouth daily.   levothyroxine 75 MCG tablet Commonly known as:  SYNTHROID, LEVOTHROID Take 75 mcg by mouth  daily before breakfast.   multivitamin-iron-minerals-folic acid chewable tablet Chew 1 tablet by mouth daily.   nitroGLYCERIN 0.4 MG SL tablet Commonly known as:  NITROSTAT Place 0.4 mg under the tongue every 5 (five) minutes as needed for chest pain.   potassium chloride 10 MEQ tablet Commonly known as:  K-DUR Take 1 tablet (10 mEq total) by mouth daily.   PROAIR HFA 108 (90 Base) MCG/ACT inhaler Generic drug:  albuterol Inhale 2 puffs into the lungs 3 (three) times daily as needed for wheezing or shortness of breath.   PROVENTIL HFA 108 (90 Base) MCG/ACT inhaler Generic drug:  albuterol USE 2 PUFFS EVERY 4-6 HOURS AS NEEDED FOR ASHTMA FLAREUP.      Follow-up Information    [facility] Follow up.   Specialty:  Home Health Services Why:  Physical Therapy Contact information: [place] [address] [phone]        Limmie Patricia, MD. Schedule an appointment as soon as possible for a visit in 1 week(s).   Specialty:  Endocrinology Contact information: [place] [address] [phone]        Adrian Prows, MD. Schedule an appointment as soon as possible for a visit in 1 week(s).   Specialty:  Cardiology Contact information: [address]  [place] [address] [phone]          Allergies  Allergen Reactions  . Darvon [Propoxyphene] Nausea And Vomiting  . Pineapple Nausea And Vomiting  . Shellfish Allergy Swelling    Shrimp and clams  . Codeine Nausea And Vomiting  . Meperidine Hcl Nausea And Vomiting    Reaction to demerol    Consultations:  Cardiology    Procedures/Studies: Ct Abdomen Wo Contrast  Result Date: 03/28/2016 CLINICAL DATA:  Multiple renal cysts. EXAM: CT ABDOMEN WITHOUT CONTRAST TECHNIQUE: Multidetector CT imaging of the abdomen was performed following the standard protocol without IV contrast. COMPARISON:  None. FINDINGS: Lower chest: The heart size is  enlarged. Aortic atherosclerosis. Calcification in the RCA and LAD and left circumflex coronary artery noted. There are bilateral pleural effusions noted, right greater than left. Overlying compressive type atelectasis is identified. Hepatobiliary: No focal liver abnormality. Stone within the gallbladder measures 11 mm, image 30 of series 201. Pancreas: Unremarkable. No pancreatic ductal dilatation or surrounding inflammatory changes. Spleen: Normal appearance of the spleen Adrenals/Urinary Tract: The adrenal glands appear normal. There are bilateral renal cysts identified of varying complexity which are incompletely characterized without IV contrast. This includes a hyperdense lesion arising from the inferior pole the right kidney measuring 2.2 cm in a small hyperdense lesion arising from the upper pole the left kidney measuring 1.2 cm, image 23 of series 201. No nephrolithiasis or hydronephrosis. Stomach/Bowel: Stomach is within normal limits. Appendix appears normal. No evidence of bowel wall thickening, distention, or inflammatory changes. Vascular/Lymphatic: Aortic atherosclerosis. Infrarenal abdominal aortic is a ectatic measuring 2.8 cm, image 41 of series 201. No enlarged upper abdominal lymph nodes. Other: No abdominal wall hernia or abnormality. Musculoskeletal: Degenerative disc disease noted within the lower thoracic and lumbar spine. IMPRESSION: 1. Bilateral renal cysts of varying density are incompletely characterized without IV contrast material. Consider more definitive assessment with contrast enhanced MRI of the kidneys. 2. Aortic atherosclerosis. Ectatic abdominal aorta at risk for aneurysm development. Recommend followup by ultrasound in 5 years. This recommendation follows ACR consensus guidelines: White Paper of the ACR Incidental Findings Committee II on Vascular Findings. J Am Coll Radiol 2013; 10:789-794. 3. Gallstone 4. Bilateral pleural effusions, cardiac enlargement, coronary artery  calcifications. Electronically Signed   By: Kerby Moors M.D.   On: 03/28/2016 15:07   Dg Chest 2 View  Result Date: 03/25/2016 CLINICAL DATA:  Chest congestion, abdominal pain radiating to shoulders, burning feeling in abdomen for 2 days. History of prostate cancer, hypertension, myocardial infarction. EXAM: CHEST  2 VIEW COMPARISON:  None. FINDINGS: Cardiac silhouette is mildly enlarged, mediastinal silhouette is nonsuspicious, calcified aortic knob. Coronary artery stent. Mild chronic bronchitic changes. Small bilateral pleural effusions and bibasilar strandy densities. No pneumothorax. ACDF. IMPRESSION: Small bilateral pleural effusions and bibasilar atelectasis. Mild cardiomegaly. Electronically Signed   By: Elon Alas M.D.   On: 03/25/2016 19:20   US Abdomen Complete  Result Date: 03/27/2016 CLINICAL DATA:  Elevated liver function tests. Chronic kidney disease. EXAM: ABDOMEN ULTRASOUND COMPLETE COMPARISON:  05/25/2009 abdominopelvic CT. FINDINGS: Gallbladder: 11 mm gallstone. This is nonmobile. No wall thickening or pericholecystic fluid. Sonographic Murphy's sign was not elicited. Common bile duct: Diameter: Normal, 4 mm. Liver: No focal lesion identified. Within normal limits in parenchymal echogenicity. IVC: No abnormality visualized. Pancreas: Visualized portion unremarkable. Spleen: Size and appearance within normal limits. Right Kidney: Length: 10.0 cm. No hydronephrosis. Multiple renal cysts. The largest is in the lower pole and measures maximally 11.8 cm. Left  Kidney: Length: 9.8 cm. No hydronephrosis. Multiple renal cysts. The largest is in the upper pole and measures 3.7 x 5.1 x 4.0 cm cm. There may be mild complexity involving upper pole left renal lesion of 3.2 x 3.4 x 3.6 cm. Abdominal aorta: Aortic ectasia of maximally 3.7 cm. Extensive atherosclerosis within. Other findings: Bilateral pleural effusions.  No ascites. IMPRESSION: 1. Cholelithiasis without acute cholecystitis or  biliary duct dilatation. 2. Aortic atherosclerosis and mild ectasia. 3. Bilateral pleural effusions. Electronically Signed   By: Abigail Miyamoto M.D.   On: 03/27/2016 20:04    Heart cath 03/26/16 Patient has h/o PCI 09/13/2015 and had atherectomy of the RCA and PDA lesion stent implantation to distal and mid RCA however PDA was not stented due to complex coronary anatomy and inability to cross the balloon due to tortuosity and calcification. He has history of LAD stenting in June 2015 with overlapping stents. Coronary angiogram 03/26/2016: 1. Markedly elevated LVEDP, 30 mmHg. No pressure gradient across the aortic valve. LV gram not performed to preserve contrast. 2. Mid RCA and mid to distal RCA stents placed in August 2017 are widely patent. PDA high-grade stenosis of 90-95% evident in a tandem fashion, calcified, small vessels. 3. Large LAD, previously placed stents widely patent, mild disease in the mid to distal LAD calcification evident. 4. Circumflex coronary is medium-sized, mild disease.   Recommendation: Patient's anatomy is very complex and coronary intervention probably is not a good option in view of severe anemia, renal insufficiency and diffusely diseased and calcified RCA, previously I had difficulty in advancing any devices including balloon at the site of the stenosis. Hence would recommend medical therapy. About 25 mL contrast utilized. He has markedly elevated LVEDP suggesting acute diastolic heart failure and systolic heart failure, will need gentle diuresis. However careful watch to be performed with regard to renal failure development. I have sent anemia panel and we will follow-up on this.     Echo Study Conclusions  - Left ventricle: The cavity size was normal. Systolic function was   severely reduced. The estimated ejection fraction was in the   range of 20% to 25%. Diffuse hypokinesis. Cannot exclude   hypokinesis of the anterolateral myocardium. Doppler parameters    are consistent with a reversible restrictive pattern, indicative   of decreased left ventricular diastolic compliance and/or   increased left atrial pressure (grade 3 diastolic dysfunction).   Doppler parameters are consistent with both elevated ventricular   end-diastolic filling pressure and elevated left atrial filling   pressure. - Aortic valve: There was moderate regurgitation. - Mitral valve: Normal-sized, moderately calcified annulus. Mildly   thickened, mildly calcified leaflets . There was moderate to   severe regurgitation. - Left atrium: The atrium was moderately dilated.   Anterior-posterior dimension: 44 mm. - Right ventricle: The cavity size was mildly dilated. Systolic   function was mildly reduced. - Right atrium: The atrium was mildly dilated. - Tricuspid valve: Mildly dilated annulus. There was moderate   regurgitation. - Pulmonary arteries: PA peak pressure: 60 mm Hg (S). - Pericardium, extracardiac: A trivial pericardial effusion was   identified posterior to the heart.  Impressions:  - The right ventricular systolic pressure was increased consistent   with moderate pulmonary hypertension.    Discharge Exam: Vitals:   03/29/16 1954 03/30/16 0536  BP: 126/65 (!) 126/45  Pulse:  (!) 55  Resp:  16  Temp:  98.7 F (37.1 C)   Vitals:   03/29/16 1555 03/29/16 1900 03/29/16  1954 03/30/16 0536  BP: 112/74 126/65 126/65 (!) 126/45  Pulse:  70  (!) 55  Resp:    16  Temp: 98 F (36.7 C) 97.4 F (36.3 C)  98.7 F (37.1 C)  TempSrc: Oral Axillary  Oral  SpO2: 96% 96%  95%  Weight:    67.7 kg (149 lb 3.2 oz)  Height:        General: Pt is alert, awake, not in acute distress Cardiovascular: RRR, S1/S2 +, no rubs, no gallops Respiratory: CTA bilaterally, no wheezing, no rhonchi Abdominal: Soft, NT, ND, bowel sounds + Extremities: no edema, no cyanosis    The results of significant diagnostics from this hospitalization (including imaging,  microbiology, ancillary and laboratory) are listed below for reference.     Microbiology: Recent Results (from the past 240 hour(s))  MRSA PCR Screening     Status: None   Collection Time: 03/25/16 10:24 PM  Result Value Ref Range Status   MRSA by PCR NEGATIVE NEGATIVE Final    Comment:        The GeneXpert MRSA Assay (FDA approved for NASAL specimens only), is one component of a comprehensive MRSA colonization surveillance program. It is not intended to diagnose MRSA infection nor to guide or monitor treatment for MRSA infections.      Labs: BNP (last 3 results)  Recent Labs  03/25/16 1812  BNP >8,546.2*   Basic Metabolic Panel:  Recent Labs Lab 03/26/16 0806 03/27/16 0307 03/28/16 0404 03/29/16 0300 03/30/16 0359  NA 141 139 138 139 140  K 4.2 4.8 5.0 4.5 4.0  CL 107 108 107 111 105  CO2 19* 20* 18* 21* 24  GLUCOSE 109* 122* 95 118* 119*  BUN 39* 43* 58* 64* 64*  CREATININE 1.57* 1.92* 2.13* 2.22* 2.04*  CALCIUM 8.6* 8.5* 8.1* 7.8* 8.2*   Liver Function Tests:  Recent Labs Lab 03/25/16 1812 03/27/16 0307 03/28/16 0404 03/29/16 0300 03/30/16 0359  AST 27 57* 43* 38 47*  ALT 20 63 71* 72* 93*  ALKPHOS 57 55 56 54 57  BILITOT 1.5* 2.3* 1.7* 1.8* 1.7*  PROT 6.6 6.0* 5.7* 5.3* 5.8*  ALBUMIN 3.9 3.8 3.5 3.1* 3.3*    Recent Labs Lab 03/25/16 1812 03/28/16 0404  LIPASE 20 19  AMYLASE  --  57   No results for input(s): AMMONIA in the last 168 hours. CBC:  Recent Labs Lab 03/26/16 0314 03/27/16 0307 03/28/16 0404 03/29/16 0300 03/30/16 0359  WBC 5.4 7.0 9.2 8.5 9.7  HGB 8.5* 9.5* 10.1* 9.0* 9.9*  HCT 25.5* 29.1* 30.3* 27.5* 29.6*  MCV 107.6* 109.8* 109.8* 109.6* 108.8*  PLT 286 300 282 250 251   Cardiac Enzymes:  Recent Labs Lab 03/25/16 2214 03/26/16 0314 03/26/16 0806  TROPONINI 1.25* 1.21* 0.88*   BNP: Invalid input(s): POCBNP CBG: No results for input(s): GLUCAP in the last 168 hours. D-Dimer No results for input(s):  DDIMER in the last 72 hours. Hgb A1c No results for input(s): HGBA1C in the last 72 hours. Lipid Profile No results for input(s): CHOL, HDL, LDLCALC, TRIG, CHOLHDL, LDLDIRECT in the last 72 hours. Thyroid function studies No results for input(s): TSH, T4TOTAL, T3FREE, THYROIDAB in the last 72 hours.  Invalid input(s): FREET3 Anemia work up No results for input(s): VITAMINB12, FOLATE, FERRITIN, TIBC, IRON, RETICCTPCT in the last 72 hours. Urinalysis    Component Value Date/Time   COLORURINE YELLOW 11/07/2013 2356   APPEARANCEUR CLEAR 11/07/2013 2356   LABSPEC 1.023 11/07/2013 2356   PHURINE  5.0 11/07/2013 2356   GLUCOSEU NEGATIVE 11/07/2013 2356   HGBUR NEGATIVE 11/07/2013 2356   BILIRUBINUR SMALL (A) 11/07/2013 2356   KETONESUR NEGATIVE 11/07/2013 2356   PROTEINUR NEGATIVE 11/07/2013 2356   UROBILINOGEN 1.0 11/07/2013 2356   NITRITE NEGATIVE 11/07/2013 2356   LEUKOCYTESUR NEGATIVE 11/07/2013 2356   Sepsis Labs Invalid input(s): PROCALCITONIN,  WBC,  LACTICIDVEN Microbiology Recent Results (from the past 240 hour(s))  MRSA PCR Screening     Status: None   Collection Time: 03/25/16 10:24 PM  Result Value Ref Range Status   MRSA by PCR NEGATIVE NEGATIVE Final    Comment:        The GeneXpert MRSA Assay (FDA approved for NASAL specimens only), is one component of a comprehensive MRSA colonization surveillance program. It is not intended to diagnose MRSA infection nor to guide or monitor treatment for MRSA infections.      Time coordinating discharge: 40 minutes  SIGNED:  Dessa Phi, DO [facility] Pager [phone]  If 7PM-7AM, please contact night-coverage [email or web address] Password [email or web address] 03/30/2016, 1:30 PM

## 2016-03-30 NOTE — Progress Notes (Signed)
Physical Therapy Treatment Patient Details Name: Joe Soto MRN: [id number] DOB: 1923-08-24 Today's Date: 03/30/2016    History of Present Illness Pt is a 83 y.o. male with history of HTN, CAD status post stenting last one in August 2017, ischemic cardiomyopathy, COPD, CKD stage III and anemia presented to ER 03/25/16 because of chest and abdominal discomfort; his troponin was positive for non-ST elevation MI. Other pertinent PMH includes CVA, asthma, HLD, cancer, arthritis. Cardiac cath performed 3/12.     PT Comments    Pt requiring increased encouragement to participate with PT session. Pt eventually agreeable to ambulate in room but limited distance to 15 ft with HHA. Attempting bed mobility and seated exercises to increase participation. Pt appearing more confused today, wife in agreement.    Follow Up Recommendations  Home health PT     Equipment Recommendations  None recommended by PT    Recommendations for Other Services       Precautions / Restrictions Precautions Precautions: Fall Restrictions Weight Bearing Restrictions: No    Mobility  Bed Mobility Overal bed mobility: Needs Assistance Bed Mobility: Supine to Sit     Supine to sit: Min assist     General bed mobility comments: tactile cues for pt to initiate mobility  Transfers Overall transfer level: Needs assistance Equipment used: None Transfers: Sit to/from Stand Sit to Stand: Min assist         General transfer comment: mild assistance needed for balance with standing.   Ambulation/Gait Ambulation/Gait assistance: Min guard Ambulation Distance (Feet): 15 Feet Assistive device: 1 person hand held assist Gait Pattern/deviations: Step-through pattern Gait velocity: Decreased   General Gait Details: Pt needing extensive encouragement from PT and spouse to participate with PT, refusing to ambulate any further distance.    Stairs            Wheelchair Mobility    Modified Rankin  (Stroke Patients Only)       Balance Overall balance assessment: Needs assistance Sitting-balance support: Single extremity supported Sitting balance-Leahy Scale: Fair     Standing balance support: Single extremity supported Standing balance-Leahy Scale: Fair Standing balance comment: unsteady with initial standing                    Cognition Arousal/Alertness: Awake/alert Behavior During Therapy: Flat affect Overall Cognitive Status: Impaired/Different from baseline Area of Impairment: Safety/judgement                    Exercises Other Exercises Other Exercises: LAQ bilateral 1X5 Other Exercises: ankle pumps bilateral 1X5 Other Exercises: seated hip adduction bilateral 1X10    General Comments        Pertinent Vitals/Pain Pain Assessment: Faces Faces Pain Scale: Hurts a little bit Pain Location: abdomen Pain Descriptors / Indicators: Sore Pain Intervention(s): Monitored during session    Home Living                      Prior Function            PT Goals (current goals can now be found in the care plan section) Acute Rehab PT Goals Patient Stated Goal: Get home PT Goal Formulation: With patient Time For Goal Achievement: 04/10/16 Potential to Achieve Goals: Good Progress towards PT goals: Not progressing toward goals - comment (pt refusing activity progression, limiting amb distance)    Frequency    Min 3X/week      PT Plan Current plan remains appropriate  Co-evaluation             End of Session   Activity Tolerance: Patient tolerated treatment well Patient left: in chair;with call bell/phone within reach;with family/visitor present Nurse Communication: Mobility status PT Visit Diagnosis: Other abnormalities of gait and mobility (R26.89);Muscle weakness (generalized) (M62.81)     Time: 8118-8677 PT Time Calculation (min) (ACUTE ONLY): 31 min  Charges:  $Gait Training: 8-22 mins $Therapeutic Activity: 8-22  mins                    G Codes:       Cassell Clement, PT, CSCS Pager [phone] Office [phone]  03/30/2016, 10:51 AM

## 2016-03-30 NOTE — Care Management Important Message (Signed)
Important Message  Patient Details  Name: Joe Soto MRN: [id number] Date of Birth: Mar 09, 1923   Medicare Important Message Given:  Yes    Nathen May 03/30/2016, 3:47 PM

## 2016-03-30 NOTE — Progress Notes (Signed)
Subjective:  Feels better and slightly confused and wife and husband would want to go home.  Dyspnea better and apatite improving.  Objective:  Vital Signs in the last 24 hours: Temp:  [97.4 F (36.3 C)-98.7 F (37.1 C)] 98.7 F (37.1 C) (03/16 0536) Pulse Rate:  [50-70] 55 (03/16 0536) Resp:  [16] 16 (03/16 0536) BP: (107-126)/(45-74) 126/45 (03/16 0536) SpO2:  [95 %-96 %] 95 % (03/16 0536) Weight:  [149 lb 3.2 oz (67.7 kg)] 149 lb 3.2 oz (67.7 kg) (03/16 0536)  Intake/Output from previous day: 03/15 0701 - 03/16 0700 In: 720 [P.O.:720] Out: 1400 [Urine:1400]  Physical Exam:  General appearance: alert, cooperative, appears stated age and mild distress Eyes: negative findings: lids and lashes normal Neck: no adenopathy, no carotid bruit, supple, symmetrical, trachea midline, thyroid not enlarged, symmetric, no tenderness/mass/nodules and JVD slightly up Neck: JVP - normal, carotids 2+= without bruits Resp: clear to auscultation bilaterally Chest wall: no tenderness Cardio: regular rate and rhythm, S1, S2 normal, no murmur, click, rub or gallop and Distant heart sounds. Frequent ectopy noted GI: Abdomen is soft, diffusely tender throughout, no focal abnormality, no rebound tenderness. Extremities: normal, no cyanosis or ulcerations. +2 pitting edema noted on left lower extremity. No edema on the right lower extremity. DP and PT pulses 2+.  Lab Results: BMP  Recent Labs  03/28/16 0404 03/29/16 0300 03/30/16 0359  NA 138 139 140  K 5.0 4.5 4.0  CL 107 111 105  CO2 18* 21* 24  GLUCOSE 95 118* 119*  BUN 58* 64* 64*  CREATININE 2.13* 2.22* 2.04*  CALCIUM 8.1* 7.8* 8.2*  GFRNONAA 26* 24* 27*  GFRAA 30* 28* 31*    CBC  Recent Labs Lab 03/30/16 0359  WBC 9.7  RBC 2.72*  HGB 9.9*  HCT 29.6*  PLT 251  MCV 108.8*  MCH 36.4*  MCHC 33.4  RDW 16.3*    HEMOGLOBIN A1C Lab Results  Component Value Date   HGBA1C 5.2 03/26/2016   MPG 103 03/26/2016   Cardiac  Panel (last 3 results)  Recent Labs  03/25/16 2214 03/26/16 0314 03/26/16 0806  TROPONINI 1.25* 1.21* 0.88*   TSH  Recent Labs  03/26/16 1135  TSH 20.879*   Lipid Panel     Component Value Date/Time   CHOL 116 03/26/2016 1135   TRIG 56 03/26/2016 1135   HDL 28 (L) 03/26/2016 1135   CHOLHDL 4.1 03/26/2016 1135   VLDL 11 03/26/2016 1135   LDLCALC 77 03/26/2016 1135     Hepatic Function Panel  Recent Labs  03/25/16 1812  03/28/16 0404 03/29/16 0300 03/30/16 0359  PROT 6.6  < > 5.7* 5.3* 5.8*  ALBUMIN 3.9  < > 3.5 3.1* 3.3*  AST 27  < > 43* 38 47*  ALT 20  < > 71* 72* 93*  ALKPHOS 57  < > 56 54 57  BILITOT 1.5*  < > 1.7* 1.8* 1.7*  BILIDIR 0.3  --   --   --   --   IBILI 1.2*  --   --   --   --   < > = values in this interval not displayed. Scheduled Meds: . aspirin  81 mg Oral Daily  . atenolol  50 mg Oral Daily  . clopidogrel  75 mg Oral Daily  . famotidine  40 mg Oral Daily  . furosemide  40 mg Intravenous BID  . heparin  5,000 Units Subcutaneous Q8H  . hydrALAZINE  25 mg Oral Daily  .  levothyroxine  75 mcg Oral QAC breakfast  . multivitamin with minerals  1 tablet Oral Daily  . sodium chloride flush  3 mL Intravenous Q12H   Continuous Infusions: PRN Meds:.sodium chloride, acetaminophen, albuterol, fentaNYL (SUBLIMAZE) injection, ondansetron (ZOFRAN) IV, sodium chloride flush   Imaging: Imaging results have been reviewed  Cardiac Studies:  Coronary angiogram 03/26/2016, markedly elevated LVEDP. High-grade stenosis of the PDA branch of the right coronary artery. Previously placed stent in the mid RCA and LAD widely patent. Medical therapy.  Echocardiogram 03/26/2016: Severe LV systolic dysfunction, anterolateral hypokinesis, restrictive physiology. moderate to severe mitral regurgitation. Moderate tricuspid regurgitation. Moderate pulmonary hypertension.  EKG 03/25/2016: Normal sinus rhythm at a rate of 67 beats a minute, normal axis. Anterior  infarct old. Lateral ischemia. Compared to prior EKG, lateral ischemia is new.   Assessment/Plan:  1. Acute on chronic systolic and diastolic heart failure 2. Acute on Chronic renal insufficiency stage III due to acute on chronic systolic and diastolic heart failure, contrast exposure. 3. Non-ST elevation myocardial infarction, probably related to small vessel disease. CAD, medical therapy 4. Chronic anemia suspect anemia of chronic disease, anemia panel unyielding. 5. Hypertension 6. Hyperlipidemia 7. Abdominal discomfort, diffuse and nonspecific, CT of abdomen shows bilateral renal cysts.   Recommendation:  Can be discharged home today.  Lasix 40 mg daily.  I'll follow him up in the outpatient basis.  Serum creatinine stable.  I have already set him up for home health care and home physical therapy. Continue to hold ARB and statins for now due to elevated LFT.    Adrian Prows, MD 03/30/2016, 10:13 AM [facility] Pager: [phone] Office: [phone] If no answer: [phone]

## 2016-03-30 NOTE — Progress Notes (Signed)
Pt discharged to home with family via W/C, condition stable, discharge education complete, medication instruction including med indication and administration given to pts daughter and spouse with verbal understanding obtained,

## 2016-04-02 ENCOUNTER — Ambulatory Visit: Payer: Medicare Other | Admitting: Pulmonary Disease

## 2016-04-09 ENCOUNTER — Other Ambulatory Visit: Payer: Self-pay | Admitting: Pulmonary Disease

## 2016-05-15 DEATH — deceased

## 2018-03-22 IMAGING — CT CT ABDOMEN W/O CM
2 of 4 series · 14 of 46 positions shown, 16 images · non-contrast
Comparison: None.

CLINICAL DATA: Multiple renal cysts.

EXAM:
CT ABDOMEN WITHOUT CONTRAST
TECHNIQUE: Multidetector CT imaging of the abdomen was performed following the
standard protocol without IV contrast.

[Series 201: routine, idose (2) · axial · 0.77mm/px · z∈[-9,+221]mm · 11 of 52 slices shown, 13 images]
[im 3/52  soft-tissue]
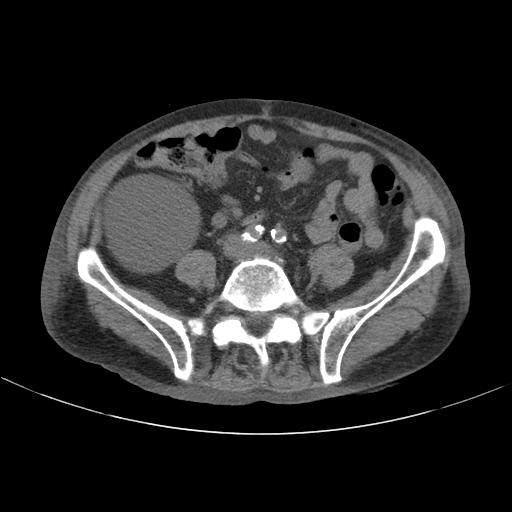
[im 3/52  bone]
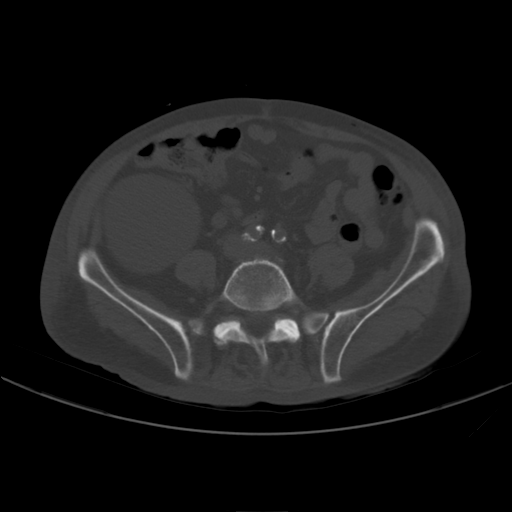
[im 8/52  soft-tissue]
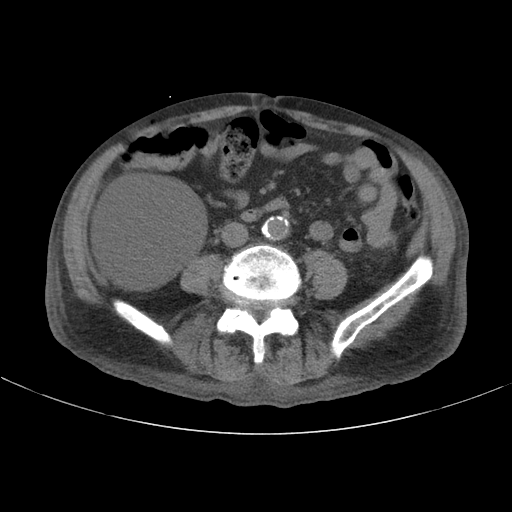
[im 13/52  soft-tissue]
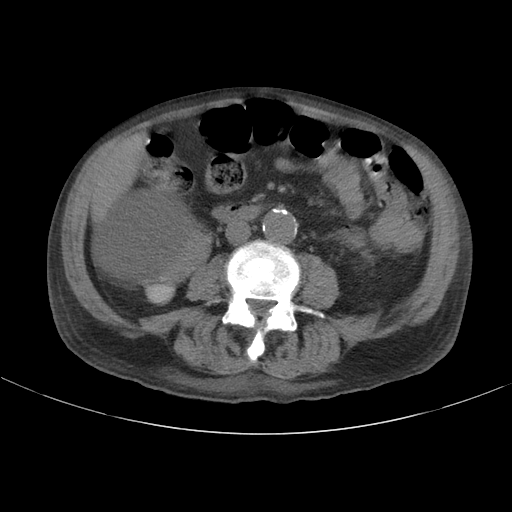
[im 18/52  soft-tissue]
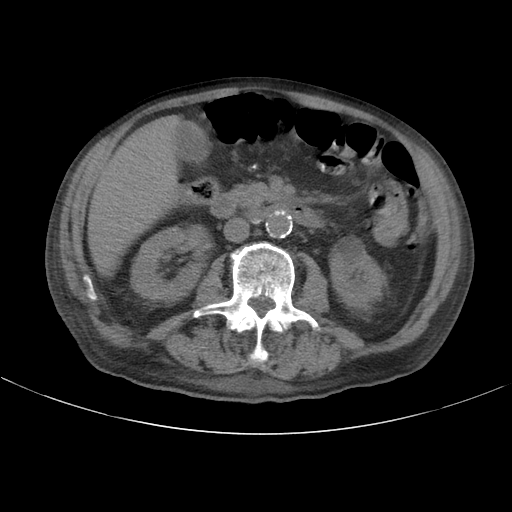
[im 21/52  soft-tissue]
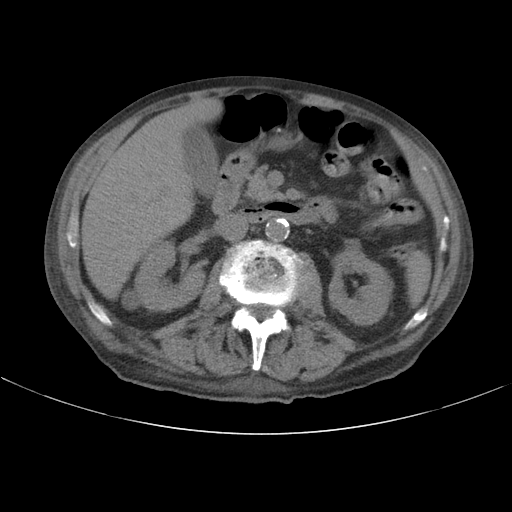
[im 26/52  soft-tissue]
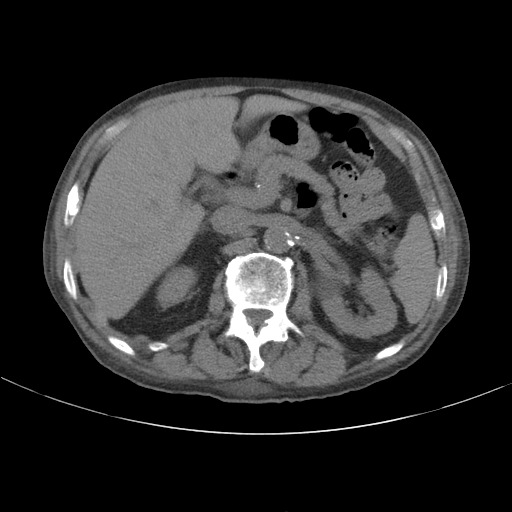
[im 31/52  soft-tissue]
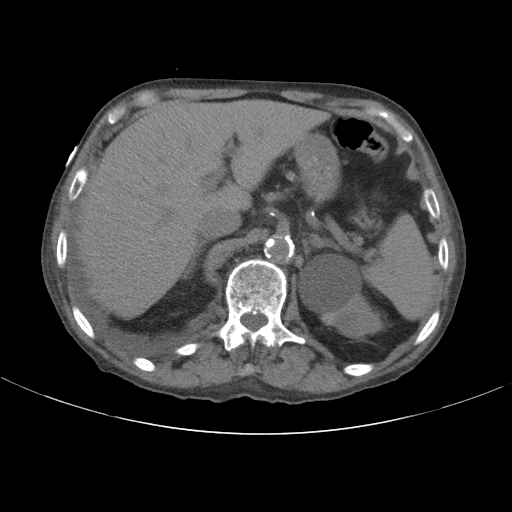
[im 34/52  soft-tissue]
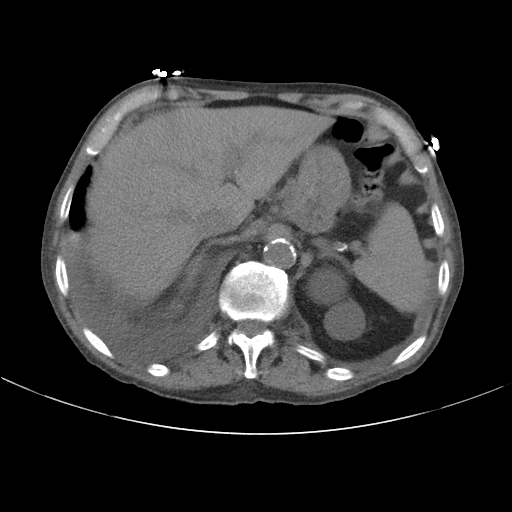
[im 39/52  soft-tissue]
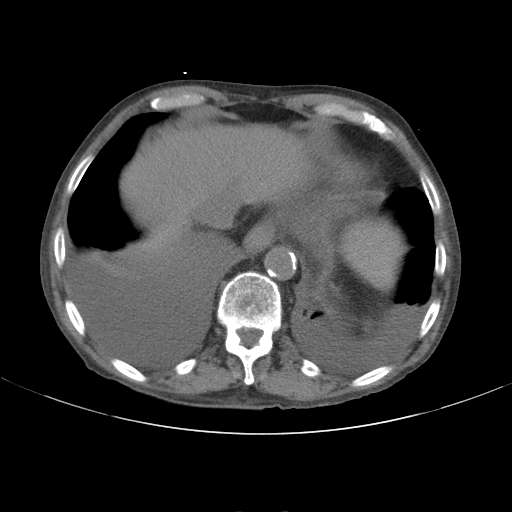
[im 39/52  bone]
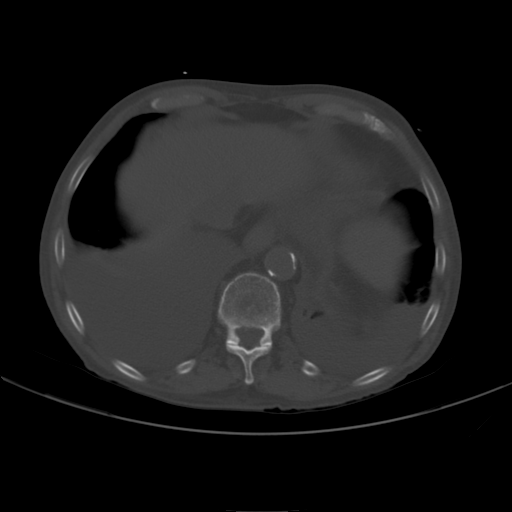
[im 44/52  soft-tissue]
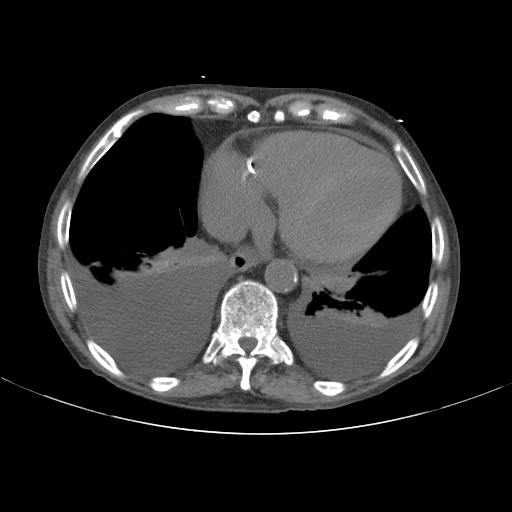
[im 49/52  soft-tissue]
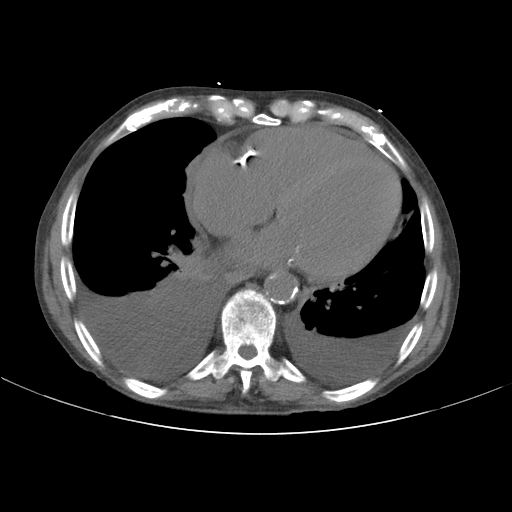

[Series 203: coronals, idose (2) · coronal · 0.45mm/px · 3 of 103 slices shown]
[im 35/103  soft-tissue]
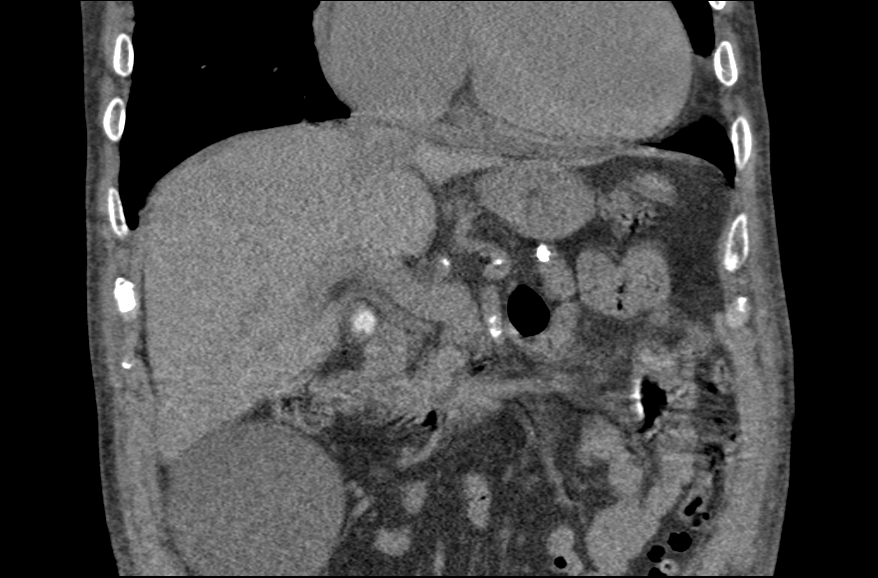
[im 46/103  soft-tissue]
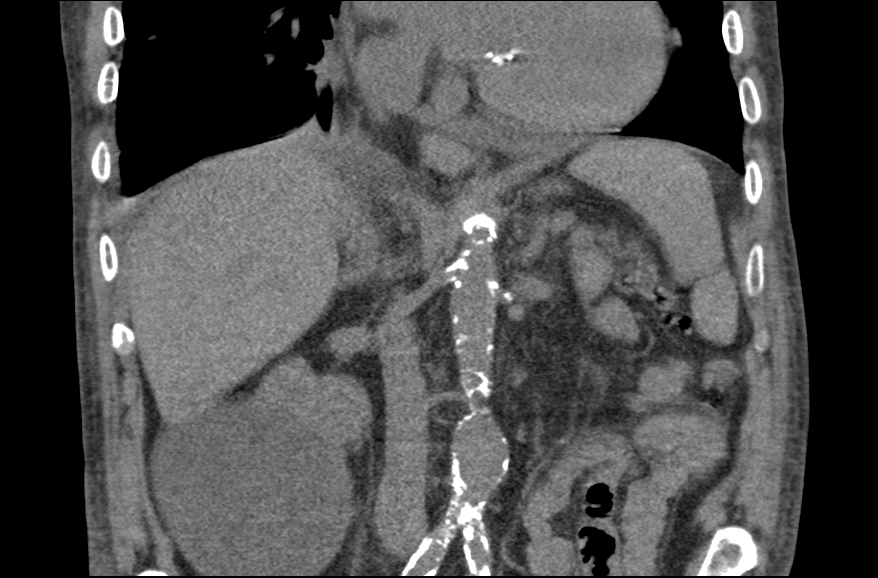
[im 57/103  soft-tissue]
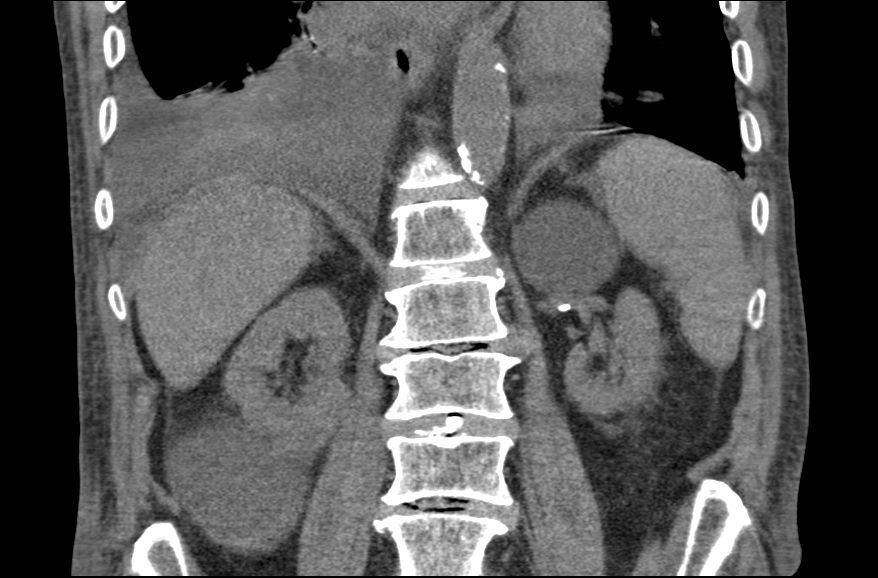

[14 of 46 positions shown; findings below may reference images not displayed]

FINDINGS: Lower chest: The heart size is enlarged. Aortic atherosclerosis.
Calcification in the RCA and LAD and left circumflex coronary artery
noted. There are bilateral pleural effusions noted, right greater
than left. Overlying compressive type atelectasis is identified.

Hepatobiliary: No focal liver abnormality. Stone within the
gallbladder measures 11 mm, image 30 of series 201.

Pancreas: Unremarkable. No pancreatic ductal dilatation or
surrounding inflammatory changes.

Spleen: Normal appearance of the spleen

Adrenals/Urinary Tract: The adrenal glands appear normal. There are
bilateral renal cysts identified of varying complexity which are
incompletely characterized without IV contrast. This includes a
hyperdense lesion arising from the inferior pole the right kidney
measuring 2.2 cm in a small hyperdense lesion arising from the upper
pole the left kidney measuring 1.2 cm, image 23 of series 201. No
nephrolithiasis or hydronephrosis.

Stomach/Bowel: Stomach is within normal limits. Appendix appears
normal. No evidence of bowel wall thickening, distention, or
inflammatory changes.

Vascular/Lymphatic: Aortic atherosclerosis. Infrarenal abdominal
aortic is a ectatic measuring 2.8 cm, image 41 of series 201. No
enlarged upper abdominal lymph nodes.

Other: No abdominal wall hernia or abnormality.

Musculoskeletal: Degenerative disc disease noted within the lower
thoracic and lumbar spine.
IMPRESSION: 1. Bilateral renal cysts of varying density are incompletely
characterized without IV contrast material. Consider more definitive
assessment with contrast enhanced MRI of the kidneys.
2. Aortic atherosclerosis. Ectatic abdominal aorta at risk for
aneurysm development. Recommend followup by ultrasound in 5 years.
This recommendation follows ACR consensus guidelines: White Paper of
the ACR Incidental Findings Committee II on Vascular Findings. [HOSPITAL] 8082; [DATE].
3. Gallstone
4. Bilateral pleural effusions, cardiac enlargement, coronary artery
calcifications.
